# Patient Record
Sex: Female | Born: 1995 | Race: White | Hispanic: No | Marital: Married | Smoking: Never smoker
Health system: Southern US, Community
[De-identification: ages and names within clinical notes are randomized; demographics above are authoritative.]

## PROBLEM LIST (undated history)

## (undated) DIAGNOSIS — K219 Gastro-esophageal reflux disease without esophagitis: Secondary | ICD-10-CM

## (undated) DIAGNOSIS — O24419 Gestational diabetes mellitus in pregnancy, unspecified control: Secondary | ICD-10-CM

## (undated) HISTORY — DX: Gestational diabetes mellitus in pregnancy, unspecified control: O24.419

---

## 2014-04-08 ENCOUNTER — Emergency Department: Payer: Self-pay | Admitting: Emergency Medicine

## 2016-02-18 DIAGNOSIS — M25571 Pain in right ankle and joints of right foot: Secondary | ICD-10-CM | POA: Diagnosis not present

## 2016-02-21 DIAGNOSIS — S92109A Unspecified fracture of unspecified talus, initial encounter for closed fracture: Secondary | ICD-10-CM | POA: Diagnosis not present

## 2016-02-21 DIAGNOSIS — M25572 Pain in left ankle and joints of left foot: Secondary | ICD-10-CM | POA: Diagnosis not present

## 2016-02-28 DIAGNOSIS — M79672 Pain in left foot: Secondary | ICD-10-CM | POA: Diagnosis not present

## 2016-02-28 DIAGNOSIS — M25572 Pain in left ankle and joints of left foot: Secondary | ICD-10-CM | POA: Diagnosis not present

## 2016-03-07 NOTE — H&P (Signed)
Laurie Burns comes in with a new problem.  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  Longstanding issues bunion first MP joint, right foot.  Getting progressively worse.  Does not like the extreme of motion.  Altering what she can wear foot wear wise.  No numbness.  No tingling.  No symptoms opposite foot.   History and general exam are reviewed.   EXAMINATION: Specifically, healthy 20 year-old female.  Height: 5?10.  Weight: 150 pounds.  Normal ligamentous laxity.  Normal gait and stance.  Right foot has hallux valgus without significant metatarsus prima vara.  Sore medially.  A little over her sesamoids, but most of this is medial.  Neurovascularly intact.  Hallux valgus is moderately correctable.  No grating.  No crepitus.  No hallux rigidus.  No symptoms opposite left foot.    X-RAYS: Three view x-rays on the right with comparison, AP both feet, completed.  No degenerative changes.  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  There is also what appears to be a loose body or old bony injury at the base of the proximal phalanx laterally.  This is on the left asymptomatic side.  First-second intermetatarsal angle 9.4 on the left and 11.6 on the right.  Not significant metatarsus prima vara.    IMPRESSION: Worsening symptoms of hallux valgus, right foot.    PLAN: We have discussed definitive treatment.  Intermetatarsal angle not bad.  We have talked about [employer] corrective osteotomy.  Procedure, risks, benefits and complications reviewed.  Paperwork complete.  All questions answered.  What to expect post-op reviewed.  She is going to try to arrange this over Christmas break.  See her at the time of operative intervention.    

## 2016-03-16 ENCOUNTER — Encounter (HOSPITAL_BASED_OUTPATIENT_CLINIC_OR_DEPARTMENT_OTHER): Payer: Self-pay

## 2016-03-16 ENCOUNTER — Ambulatory Visit (HOSPITAL_BASED_OUTPATIENT_CLINIC_OR_DEPARTMENT_OTHER): Admit: 2016-03-16 | Payer: Self-pay | Admitting: Orthopedic Surgery

## 2016-03-16 DIAGNOSIS — M79672 Pain in left foot: Secondary | ICD-10-CM | POA: Diagnosis not present

## 2016-03-16 DIAGNOSIS — M25572 Pain in left ankle and joints of left foot: Secondary | ICD-10-CM | POA: Diagnosis not present

## 2016-03-16 DIAGNOSIS — S92109A Unspecified fracture of unspecified talus, initial encounter for closed fracture: Secondary | ICD-10-CM | POA: Diagnosis not present

## 2016-03-16 SURGERY — BUNIONECTOMY, WITH METATARSAL OSTEOTOMY
Anesthesia: General | Laterality: Right

## 2016-03-17 DIAGNOSIS — Z6824 Body mass index (BMI) 24.0-24.9, adult: Secondary | ICD-10-CM | POA: Diagnosis not present

## 2016-03-17 DIAGNOSIS — Z01419 Encounter for gynecological examination (general) (routine) without abnormal findings: Secondary | ICD-10-CM | POA: Diagnosis not present

## 2016-04-05 DIAGNOSIS — H5213 Myopia, bilateral: Secondary | ICD-10-CM | POA: Diagnosis not present

## 2016-12-06 DIAGNOSIS — H539 Unspecified visual disturbance: Secondary | ICD-10-CM | POA: Diagnosis not present

## 2016-12-06 DIAGNOSIS — R112 Nausea with vomiting, unspecified: Secondary | ICD-10-CM | POA: Diagnosis not present

## 2016-12-06 DIAGNOSIS — G43909 Migraine, unspecified, not intractable, without status migrainosus: Secondary | ICD-10-CM | POA: Diagnosis not present

## 2016-12-11 ENCOUNTER — Encounter: Payer: Self-pay | Admitting: *Deleted

## 2016-12-11 ENCOUNTER — Emergency Department
Admission: EM | Admit: 2016-12-11 | Discharge: 2016-12-11 | Disposition: A | Discharge status: Home / Self Care | Payer: BLUE CROSS/BLUE SHIELD | Attending: Emergency Medicine | Admitting: Emergency Medicine

## 2016-12-11 DIAGNOSIS — R51 Headache: Secondary | ICD-10-CM | POA: Diagnosis present

## 2016-12-11 DIAGNOSIS — G43109 Migraine with aura, not intractable, without status migrainosus: Secondary | ICD-10-CM | POA: Insufficient documentation

## 2016-12-11 LAB — POCT PREGNANCY, URINE: PREG TEST UR: NEGATIVE

## 2016-12-11 NOTE — ED Triage Notes (Signed)
States on Wednesday she had a migraine and was given the migraine cocktail, states since then she has felt "out of it", states continued headache and neck stiffness, states she goes to [employer] and is worried something is going around

## 2016-12-11 NOTE — ED Notes (Signed)
AAOx3.  Skin warm and dry.  MAE equally and strong. Ambulates with easy and steady gait.

## 2016-12-11 NOTE — ED Provider Notes (Signed)
[facility] Emergency Department Provider Note   ____________________________________________    I have reviewed the triage vital signs and the nursing notes.   HISTORY  Chief Complaint Headache     HPI Laurie Burns is a 21 y.o. female Who presents with complaints of mild intermittent headaches over the last 5-6 days. Patient reports she has a history of migraines and she has been having ROS prior to these headaches but they've not been as severe as typical for her. Sheis concerned because her college notified her that there was a Consulting civil engineer with meningitis there but she had no interaction with the student. She does not have fevers or chills. She has no altered mental status. No neuro deficits. Overall she feels quite well, mother agrees. She has symptoms of a mildtorticollis/cervical sprain.currently she has no headache.   History reviewed. No pertinent past medical history.  There are no active problems to display for this patient.   History reviewed. No pertinent surgical history.  Prior to Admission medications   Not on File     Allergies Patient has no known allergies.  No family history on file.  Social History Social History  Substance Use Topics  . Smoking status: Never Smoker  . Smokeless tobacco: Never Used  . Alcohol use No    Review of Systems  Constitutional: No fever/chills Eyes: No visual changes.  ENT: No sore throat. Cardiovascular: Denies chest pain. Respiratory: Denies shortness of breath. Gastrointestinal: No abdominal pain.  No nausea, no vomiting.   Genitourinary: Negative for dysuria. Musculoskeletal: Negative for back pain. Skin: Negative for rash. Neurological: Negative for headaches or weakness   ____________________________________________   PHYSICAL EXAM:  VITAL SIGNS: ED Triage Vitals  Enc Vitals Group     BP 12/11/16 1115 133/80     Pulse Rate 12/11/16 1115 85     Resp 12/11/16 1115 18    Temp 12/11/16 1115 98.3 F (36.8 C)     Temp Source 12/11/16 1115 Oral     SpO2 12/11/16 1115 99 %     Weight 12/11/16 1114 68 kg (150 lb)     Height 12/11/16 1114 1.753 m ( )     Head Circumference --      Peak Flow --      Pain Score 12/11/16 1115 2     Pain Loc --      Pain Edu? --      Excl. in GC? --     Constitutional: Alert and oriented. No acute distress. Eyes: Conjunctivae are normal. PERRLA Head: Atraumatic. Nose: No congestion/rhinnorhea. Mouth/Throat: Mucous membranes are moist.   Neck:  Mild tenderness to palpation of her left trapezius insertion site Cardiovascular: Normal rate, regular rhythm. Grossly normal heart sounds.  Good peripheral circulation. Respiratory: Normal respiratory effort.  No retractions. Lungs CTAB. Gastrointestinal: Soft and nontender. No distention.  No CVA tenderness. Genitourinary: deferred Musculoskeletal:   Warm and well perfused Neurologic:  Normal speech and language. No gross focal neurologic deficits are appreciated.  Skin:  Skin is warm, dry and intact. No rash noted. Psychiatric: Mood and affect are normal. Speech and behavior are normal.  ____________________________________________   LABS (all labs ordered are listed, but only abnormal results are displayed)  Labs Reviewed  POCT PREGNANCY, URINE   ____________________________________________  EKG  None ____________________________________________  RADIOLOGY  None ____________________________________________   PROCEDURES  Procedure(s) performed: No    Critical Care performed: No ____________________________________________   INITIAL IMPRESSION / ASSESSMENT AND PLAN /  ED COURSE  Pertinent labs & imaging results that were available during my care of the patient were reviewed by me and considered in my medical decision making (see chart for details).  Patient well-appearing and in no distress. Laughing and joking with her mother. No evidence or concern  for meningitis. I suspect subacute migraines, she received a prescription for fioricet from prior visit. I have encouraged her to take this. Outpatient follow-up as necessary. She and mother are content with this plan    ____________________________________________   FINAL CLINICAL IMPRESSION(S) / ED DIAGNOSES  Final diagnoses:  Migraine with aura and without status migrainosus, not intractable      NEW MEDICATIONS STARTED DURING THIS VISIT:  There are no discharge medications for this patient.    Note:  This document was prepared using Dragon voice recognition software and may include unintentional dictation errors.    Jene EveryKinner, Daielle Melcher, MD 12/11/16 1455

## 2016-12-11 NOTE — ED Notes (Signed)
FN: pt presents with headache and neck pain. States "something is going around at the college".

## 2017-01-05 DIAGNOSIS — G43119 Migraine with aura, intractable, without status migrainosus: Secondary | ICD-10-CM | POA: Diagnosis not present

## 2017-01-05 DIAGNOSIS — M7918 Myalgia, other site: Secondary | ICD-10-CM | POA: Diagnosis not present

## 2017-05-28 DIAGNOSIS — H5213 Myopia, bilateral: Secondary | ICD-10-CM | POA: Diagnosis not present

## 2017-07-27 DIAGNOSIS — Z01419 Encounter for gynecological examination (general) (routine) without abnormal findings: Secondary | ICD-10-CM | POA: Diagnosis not present

## 2017-07-27 DIAGNOSIS — Z6824 Body mass index (BMI) 24.0-24.9, adult: Secondary | ICD-10-CM | POA: Diagnosis not present

## 2017-08-21 DIAGNOSIS — N946 Dysmenorrhea, unspecified: Secondary | ICD-10-CM | POA: Diagnosis not present

## 2017-12-28 ENCOUNTER — Encounter: Payer: Self-pay | Admitting: Primary Care

## 2017-12-28 ENCOUNTER — Ambulatory Visit: Payer: BLUE CROSS/BLUE SHIELD | Admitting: Primary Care

## 2017-12-28 VITALS — BP 100/72 | HR 71 | Temp 98.2°F | Ht 69.0 in | Wt 176.5 lb

## 2017-12-28 DIAGNOSIS — G43709 Chronic migraine without aura, not intractable, without status migrainosus: Secondary | ICD-10-CM

## 2017-12-28 DIAGNOSIS — G43909 Migraine, unspecified, not intractable, without status migrainosus: Secondary | ICD-10-CM | POA: Insufficient documentation

## 2017-12-28 DIAGNOSIS — F32A Depression, unspecified: Secondary | ICD-10-CM

## 2017-12-28 DIAGNOSIS — F419 Anxiety disorder, unspecified: Secondary | ICD-10-CM | POA: Diagnosis not present

## 2017-12-28 DIAGNOSIS — F329 Major depressive disorder, single episode, unspecified: Secondary | ICD-10-CM

## 2017-12-28 MED ORDER — FLUOXETINE HCL 10 MG PO TABS: 10.0000 mg | ORAL_TABLET | Freq: Every day | ORAL | 1 refills | Status: DC

## 2017-12-28 NOTE — Patient Instructions (Signed)
Start fluoxetine 10 mg tablets daily for anxiety and depression.  Schedule a follow up visit with me in 6 weeks.   It was a pleasure to meet you today! Please don't hesitate to call or message me with any questions. Welcome to [facility]!

## 2017-12-28 NOTE — Assessment & Plan Note (Signed)
Chronic for years, no prior treatment. Increased symptoms for the last 6 months. GAD 7 score of 15 and PHQ 9 score of 19 today.   Discussed various treatment options including therapy vs medication. She opts for both. Referral placed for therapy.   Rx for fluoxetine 10 mg sent to pharmacy. We discussed possible side effects of headache, GI upset, drowsiness, and SI/HI. If thoughts of SI/HI develop, we discussed to present to the emergency immediately. Patient verbalized understanding.   Follow up in 6 weeks for re-evaluation.

## 2017-12-28 NOTE — Assessment & Plan Note (Signed)
Chronic and infrequent overall. Continue PRN sumatriptan 100 mg.

## 2017-12-28 NOTE — Progress Notes (Signed)
Subjective:    Patient ID: Laurie Burns, female    DOB: 02/07/1996, 22 y.o.   MRN: [id number]  HPI  Laurie Burns is a 22 year old female who presents today to establish care and discuss the problems mentioned below. Will obtain old records.  1) Anxiety and Depression: History of intermittent symptoms of anxiety and depression for years, has never been treated. Over the last 6 months she's noticed increased symptoms of tearfulness, panic attacks, feeling anxious, decreased self worth, low confidence, worry. She was at a football game having a good time and suddenly experienced a panic attack. She denies SI/HI. GAD 7 score of 15 and PHQ 9 score of 19 today.   2) Migraines: Chronic and intermittent for years. Once following with neurology who provided her with a prescription for sumatriptan 100 mg. Her migraines are typically located behind her eyes. She'll also experience symptoms of nausea, photophobia, phonophobia. Her last migraine was this past Summer 2019.  Review of Systems  Respiratory: Negative for shortness of breath.   Cardiovascular: Negative for chest pain.  Genitourinary: Negative for menstrual problem.  Neurological:       History of migraines  Psychiatric/Behavioral:       See HPI       History reviewed. No pertinent past medical history.   Social History   Socioeconomic History  . Marital status: Single    Spouse name: Not on file  . Number of children: Not on file  . Years of education: Not on file  . Highest education level: Not on file  Occupational History  . Not on file  Social Needs  . Financial resource strain: Not on file  . Food insecurity:    Worry: Not on file    Inability: Not on file  . Transportation needs:    Medical: Not on file    Non-medical: Not on file  Tobacco Use  . Smoking status: Never Smoker  . Smokeless tobacco: Never Used  Substance and Sexual Activity  . Alcohol use: No  . Drug use: Not on file  . Sexual activity: Not on  file  Lifestyle  . Physical activity:    Days per week: Not on file    Minutes per session: Not on file  . Stress: Not on file  Relationships  . Social connections:    Talks on phone: Not on file    Gets together: Not on file    Attends religious service: Not on file    Active member of club or organization: Not on file    Attends meetings of clubs or organizations: Not on file    Relationship status: Not on file  . Intimate partner violence:    Fear of current or ex partner: Not on file    Emotionally abused: Not on file    Physically abused: Not on file    Forced sexual activity: Not on file  Other Topics Concern  . Not on file  Social History Narrative   Graduated at [employer]   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    History reviewed. No pertinent surgical history.  No family history on file.  No Known Allergies  Current Outpatient Medications on File Prior to Visit  Medication Sig Dispense Refill  . LO LOESTRIN FE 1 MG-10 MCG / 10 MCG tablet Take 1 tablet by mouth daily.   3  . SUMAtriptan (IMITREX) 100 MG tablet Take 100 mg by mouth every 2 (two) hours as needed. May  repeat in 2 hours if headache persists or recurs.     No current facility-administered medications on file prior to visit.     BP 100/72   Pulse 71   Temp 98.2 F (36.8 C) (Oral)   Ht 5\' 9"  (1.753 m)   Wt 176 lb 8 oz (80.1 kg)   LMP 12/16/2017   SpO2 98%   BMI 26.06 kg/m    Objective:   Physical Exam  Constitutional: She appears well-nourished.  Neck: Neck supple.  Cardiovascular: Normal rate and regular rhythm.  Respiratory: Effort normal and breath sounds normal.  Skin: Skin is warm and dry.  Psychiatric: She has a normal mood and affect.           Assessment & Plan:

## 2018-01-14 ENCOUNTER — Ambulatory Visit: Payer: BLUE CROSS/BLUE SHIELD | Admitting: Family Medicine

## 2018-01-14 ENCOUNTER — Encounter: Payer: Self-pay | Admitting: Family Medicine

## 2018-01-14 VITALS — BP 118/76 | HR 95 | Temp 98.3°F | Ht 69.0 in | Wt 178.0 lb

## 2018-01-14 DIAGNOSIS — H6593 Unspecified nonsuppurative otitis media, bilateral: Secondary | ICD-10-CM | POA: Diagnosis not present

## 2018-01-14 DIAGNOSIS — H65193 Other acute nonsuppurative otitis media, bilateral: Secondary | ICD-10-CM

## 2018-01-14 DIAGNOSIS — H6591 Unspecified nonsuppurative otitis media, right ear: Secondary | ICD-10-CM

## 2018-01-14 DIAGNOSIS — H9313 Tinnitus, bilateral: Secondary | ICD-10-CM | POA: Diagnosis not present

## 2018-01-14 MED ORDER — AMOXICILLIN-POT CLAVULANATE 875-125 MG PO TABS: 1.0000 | ORAL_TABLET | Freq: Two times a day (BID) | ORAL | 0 refills | Status: DC

## 2018-01-14 NOTE — Patient Instructions (Signed)
Take either Claritin, zyrtec or allegra once daily for next 1-2 weeks to help dry up ear/sinus congestion

## 2018-01-14 NOTE — Progress Notes (Signed)
   Subjective:    Patient ID: Laurie Burns, female    DOB: 02-Jun-1995, 22 y.o.   MRN: [id number]  HPI  Patient presents to clinic c/o ringing & pain in both ears for 4-5 days. Right ear is worse than left. She did take some sudafed for a few doses and this seemed to help relieve some pressure, but pain persisted. Denies fever or chills. Denies hearing loss.   She is a patient of Vernona Rieger NP at [facility]. She was recently started on fluoxetine to treat anxiety & depression and uses imitrex as needed for migraine.   Patient Active Problem List   Diagnosis Date Noted  . Anxiety and depression 12/28/2017  . Migraines 12/28/2017   Social History   Tobacco Use  . Smoking status: Never Smoker  . Smokeless tobacco: Never Used  Substance Use Topics  . Alcohol use: No   Review of Systems   Constitutional: Negative for chills, fatigue and fever.  HENT: +ringing in both ears, bilat ear pain R>L, +pressure in ears.   Eyes: Negative.   Respiratory: Negative for cough, shortness of breath and wheezing.   Cardiovascular: Negative for chest pain, palpitations and leg swelling.  Gastrointestinal: Negative for abdominal pain, diarrhea, nausea and vomiting.  Genitourinary: Negative for dysuria, frequency and urgency.  Musculoskeletal: Negative for arthralgias and myalgias.  Skin: Negative for color change, pallor and rash.  Neurological: Negative for syncope, light-headedness and headaches.  Psychiatric/Behavioral: The patient is not nervous/anxious.       Objective:   Physical Exam  Constitutional: She is oriented to person, place, and time. She appears well-nourished. No distress.  HENT:  Head: Normocephalic and atraumatic.  Right Ear: Hearing normal. Tympanic membrane is injected, erythematous and bulging. A middle ear effusion is present.  Left Ear: Hearing normal. Tympanic membrane is bulging. A middle ear effusion is present.  Eyes: EOM are normal. No scleral icterus.    Neck: Neck supple. No tracheal deviation present.  Cardiovascular: Normal rate and regular rhythm.  Pulmonary/Chest: Effort normal and breath sounds normal. No respiratory distress.  Musculoskeletal: Normal range of motion. She exhibits no edema.  Neurological: She is alert and oriented to person, place, and time. No cranial nerve deficit.  Skin: Skin is warm and dry. No pallor.  Psychiatric: She has a normal mood and affect. Her behavior is normal.  Nursing note and vitals reviewed.  Vitals:   01/14/18 1327  BP: 118/76  Pulse: 95  Temp: 98.3 F (36.8 C)  SpO2: 99%      Assessment & Plan:   Right otitis medica/bilat middle ear effusions -- Augmentin BID for 10 days to treat ear infection. Advised to take daily OTC antihistamine like Claritin, allegra or zyrtec consistently for next 1-2 weeks and can also use sudafed as needed to help congestion.   Ringing in ears -- Suspect it is related to ear infection and fluid behind TMs, if this does not resolve once ear infection treatment is complete. Return to clinic for recheck  Keep regularly scheduled follow up as planned. Return to clinic sooner if needed.

## 2018-01-17 ENCOUNTER — Telehealth: Payer: Self-pay | Admitting: Primary Care

## 2018-01-17 NOTE — Telephone Encounter (Signed)
Copied from CRM [id number]. Topic: General - Inquiry >> Jan 17, 2018 10:26 AM Alexander Bergeron B wrote: Reason for CRM: pt is calling today b/c the ear infection she was seen for and given abx; she is not feeling better at all; pt is calling for advisement on what to do next or if she should just give it more time to give the abx to work; pt is feeling ear ringing; while swallowing she hears a crackling sound; pt is asking for a nurse to contact her for assistance; contact pt to advise

## 2018-01-17 NOTE — Telephone Encounter (Signed)
Please tell patient to take allegra 180 mg once daily and can also take sudafed OTC to help congestion. Also can do saline nasal sprays to help congestion. Please complete full course of antibiotics as well. The allegra and sudafed will help dry up congestion.

## 2018-01-17 NOTE — Telephone Encounter (Signed)
Called Pt to see if she took all of her antibiotics which has not. Told Pt she needs to finish taking the antibiotics. I also told Pt about taking Allegra and Sudafed as well. Pt stated she understood and had no questions.

## 2018-01-29 ENCOUNTER — Ambulatory Visit: Payer: BLUE CROSS/BLUE SHIELD | Admitting: Psychology

## 2018-02-14 ENCOUNTER — Ambulatory Visit: Payer: BLUE CROSS/BLUE SHIELD | Admitting: Primary Care

## 2018-02-14 ENCOUNTER — Encounter: Payer: Self-pay | Admitting: Primary Care

## 2018-02-14 DIAGNOSIS — F419 Anxiety disorder, unspecified: Secondary | ICD-10-CM | POA: Diagnosis not present

## 2018-02-14 DIAGNOSIS — F329 Major depressive disorder, single episode, unspecified: Secondary | ICD-10-CM

## 2018-02-14 DIAGNOSIS — F32A Depression, unspecified: Secondary | ICD-10-CM

## 2018-02-14 MED ORDER — FLUOXETINE HCL 10 MG PO TABS: 10.0000 mg | ORAL_TABLET | Freq: Every day | ORAL | 1 refills | Status: DC

## 2018-02-14 NOTE — Progress Notes (Signed)
Subjective:    Patient ID: Laurie Burns, female    DOB: [birth year], 22 y.o.   MRN: [id number]  HPI  [name] is a 22 year old female who presents today for follow up of anxiety and depression.  She was last evaluated in late September 2019 with reports of chronic symptoms of panic attacks, tearfulness, feeling anxious, decreased self worth, daily worry. GAD 7 score of 15 and PHQ 9 score of 19 that day so we initiated Fluoxetine 10 mg and referred her for therapy.  Since her last visit she's doing better. She accidentally missed her appointment with the therapist, plans on rescheduling. Positive effects of her medication include able to manage stress better, finds that she dwells less on things, improved self worth, less tearfulness. She does have difficulty sleeping as her mind will race. This was problematic before treatment. She does apply essential oils with some improvement.   She denies SI/HI, nausea, GI upset. She does have intermittent loose stools.   Review of Systems  Respiratory: Negative for shortness of breath.   Cardiovascular: Negative for chest pain.  Gastrointestinal: Negative for abdominal pain and nausea.  Psychiatric/Behavioral: Positive for sleep disturbance. Negative for suicidal ideas.       See HPI       No past medical history on file.   Social History   Socioeconomic History  . Marital status: Single    Spouse name: Not on file  . Number of children: Not on file  . Years of education: Not on file  . Highest education level: Not on file  Occupational History  . Not on file  Social Needs  . Financial resource strain: Not on file  . Food insecurity:    Worry: Not on file    Inability: Not on file  . Transportation needs:    Medical: Not on file    Non-medical: Not on file  Tobacco Use  . Smoking status: Never Smoker  . Smokeless tobacco: Never Used  Substance and Sexual Activity  . Alcohol use: No  . Drug use: Not on file  . Sexual  activity: Not on file  Lifestyle  . Physical activity:    Days per week: Not on file    Minutes per session: Not on file  . Stress: Not on file  Relationships  . Social connections:    Talks on phone: Not on file    Gets together: Not on file    Attends religious service: Not on file    Active member of club or organization: Not on file    Attends meetings of clubs or organizations: Not on file    Relationship status: Not on file  . Intimate partner violence:    Fear of current or ex partner: Not on file    Emotionally abused: Not on file    Physically abused: Not on file    Forced sexual activity: Not on file  Other Topics Concern  . Not on file  Social History Narrative   Graduated at [employer]   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    No past surgical history on file.  No family history on file.  No Known Allergies  Current Outpatient Medications on File Prior to Visit  Medication Sig Dispense Refill  . FLUoxetine (PROZAC) 10 MG tablet Take 1 tablet (10 mg total) by mouth daily. 30 tablet 1  . LO LOESTRIN FE 1 MG-10 MCG / 10 MCG tablet Take 1 tablet by mouth daily.  3  . SUMAtriptan (IMITREX) 100 MG tablet Take 100 mg by mouth every 2 (two) hours as needed. May repeat in 2 hours if headache persists or recurs.     No current facility-administered medications on file prior to visit.     BP 108/72   Pulse 75   Temp 98 F (36.7 C) (Oral)   Ht 5\' 9"  (1.753 m)   Wt 182 lb (82.6 kg)   LMP 01/27/2018   SpO2 97%   BMI 26.88 kg/m    Objective:   Physical Exam  Constitutional: She appears well-nourished.  Cardiovascular: Normal rate and regular rhythm.  Respiratory: Effort normal and breath sounds normal.  Skin: Skin is warm and dry.  Psychiatric: She has a normal mood and affect.           Assessment & Plan:

## 2018-02-14 NOTE — Assessment & Plan Note (Signed)
Improved on Fluoxetine 10 mg. Denies SI/HI. Discussed to reschedule an appointment with the therapist. Continue Fluoxetine 10 mg for now.  Trial of Melatonin 5 mg-10 mg HS for sleep. She will update.

## 2018-02-14 NOTE — Patient Instructions (Signed)
Continue fluoxetine 10 mg for anxiety and depression  You may try Melatonin 5 mg tablets for sleep. Take 1-2 hours prior to bedtime. Max dose is 10 mg in 24 hours.  Please don't hesitate to message me with any questions.  It was a pleasure to see you today!

## 2018-02-20 DIAGNOSIS — S60012A Contusion of left thumb without damage to nail, initial encounter: Secondary | ICD-10-CM | POA: Diagnosis not present

## 2018-06-28 ENCOUNTER — Telehealth: Payer: Self-pay

## 2018-06-28 NOTE — Telephone Encounter (Signed)
Received IBC from patient reporting increased abdominal pain, back pain, and numbness in right arm secondary to pain. Patient thinks it may be related to gallbladder.   Pain scale: 7/10 Takes Ibuprofen OTC.  Patient is able to complete Webex and can be reached at [phone].

## 2018-06-28 NOTE — Telephone Encounter (Signed)
Needs office visit for this

## 2018-06-28 NOTE — Telephone Encounter (Signed)
Please advise 

## 2018-07-01 DIAGNOSIS — Z7189 Other specified counseling: Secondary | ICD-10-CM | POA: Diagnosis not present

## 2018-07-01 NOTE — Telephone Encounter (Signed)
Message left for patient to return my call.  Also sent patient a message through MyChart 

## 2018-07-01 NOTE — Telephone Encounter (Signed)
Appointment has been made on 07/02/2018

## 2018-07-01 NOTE — Telephone Encounter (Signed)
Laurie Burns is out for the day, can you get her scheduled?

## 2018-07-01 NOTE — Telephone Encounter (Signed)
Laurie Burns, she needs an office visit for this if possible. See phone note that I sent you on 03/27.

## 2018-07-02 ENCOUNTER — Other Ambulatory Visit: Payer: Self-pay

## 2018-07-02 ENCOUNTER — Ambulatory Visit: Payer: BLUE CROSS/BLUE SHIELD | Admitting: Primary Care

## 2018-07-02 VITALS — BP 110/74 | HR 83 | Temp 98.0°F | Ht 69.0 in | Wt 184.5 lb

## 2018-07-02 DIAGNOSIS — Z03818 Encounter for observation for suspected exposure to other biological agents ruled out: Secondary | ICD-10-CM | POA: Diagnosis not present

## 2018-07-02 DIAGNOSIS — R1084 Generalized abdominal pain: Secondary | ICD-10-CM | POA: Diagnosis not present

## 2018-07-02 LAB — POC URINALSYSI DIPSTICK (AUTOMATED)
Bilirubin, UA: NEGATIVE
Glucose, UA: NEGATIVE
Ketones, UA: NEGATIVE
LEUKOCYTES UA: NEGATIVE
NITRITE UA: NEGATIVE
PH UA: 6 (ref 5.0–8.0)
PROTEIN UA: NEGATIVE
RBC UA: NEGATIVE
Spec Grav, UA: 1.015 (ref 1.010–1.025)
Urobilinogen, UA: 0.2 E.U./dL

## 2018-07-02 LAB — POCT URINE PREGNANCY: Preg Test, Ur: NEGATIVE

## 2018-07-02 NOTE — Progress Notes (Signed)
Subjective:    Patient ID: Laurie Burns, female    DOB: [birth year], 23 y.o.   MRN: [id number]  HPI  [name] is a 23 year old female who presents today with a chief complaint of abdominal pain.  Her pain is located to the right upper and lower abdomen. She also reports right upper extremity numbness, right lower back pain, nausea, constipation with firm stools. Her symptoms will occur with and without meals. She has noticed some bright red blood on the toilet paper after straining for a bowel movement a few times. She denies changes in her diet, new medications, changes in exercise, increased stress. Her symptoms are intermittent and began about 3-4 months ago, will sometimes bother her at night preventing her from sleeping. Several days ago was her worst episode.  She denies hematuria, urinary frequency, dysuria, fevers, vomiting. She's taken Gas-X, Tylenol at times. LMP was two weeks ago, approximately. She does have a family history of gall bladder disease.  Review of Systems  Constitutional: Negative for fatigue and fever.  Respiratory: Negative for shortness of breath.   Gastrointestinal: Positive for abdominal pain, constipation and nausea. Negative for diarrhea and vomiting.  Genitourinary: Negative for dysuria, frequency, menstrual problem and vaginal discharge.       No past medical history on file.   Social History   Socioeconomic History  . Marital status: Single    Spouse name: Not on file  . Number of children: Not on file  . Years of education: Not on file  . Highest education level: Not on file  Occupational History  . Not on file  Social Needs  . Financial resource strain: Not on file  . Food insecurity:    Worry: Not on file    Inability: Not on file  . Transportation needs:    Medical: Not on file    Non-medical: Not on file  Tobacco Use  . Smoking status: Never Smoker  . Smokeless tobacco: Never Used  Substance and Sexual Activity  . Alcohol use:  No  . Drug use: Not on file  . Sexual activity: Not on file  Lifestyle  . Physical activity:    Days per week: Not on file    Minutes per session: Not on file  . Stress: Not on file  Relationships  . Social connections:    Talks on phone: Not on file    Gets together: Not on file    Attends religious service: Not on file    Active member of club or organization: Not on file    Attends meetings of clubs or organizations: Not on file    Relationship status: Not on file  . Intimate partner violence:    Fear of current or ex partner: Not on file    Emotionally abused: Not on file    Physically abused: Not on file    Forced sexual activity: Not on file  Other Topics Concern  . Not on file  Social History Narrative   Graduated at [employer]   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    No past surgical history on file.  No family history on file.  No Known Allergies  Current Outpatient Medications on File Prior to Visit  Medication Sig Dispense Refill  . FLUoxetine (PROZAC) 10 MG tablet Take 1 tablet (10 mg total) by mouth daily. 90 tablet 1  . LO LOESTRIN FE 1 MG-10 MCG / 10 MCG tablet Take 1 tablet by mouth daily.  3  . SUMAtriptan (IMITREX) 100 MG tablet Take 100 mg by mouth every 2 (two) hours as needed. May repeat in 2 hours if headache persists or recurs.     No current facility-administered medications on file prior to visit.     BP 110/74   Pulse 83   Temp 98 F (36.7 C) (Oral)   Ht 5\' 9"  (1.753 m)   Wt 184 lb 8 oz (83.7 kg)   LMP 05/05/2018 (Within Weeks)   SpO2 98%   BMI 27.25 kg/m    Objective:   Physical Exam  Constitutional: She appears well-nourished.  Neck: Neck supple.  Cardiovascular: Normal rate and regular rhythm.  Respiratory: Effort normal and breath sounds normal.  GI: Soft. Normal appearance and bowel sounds are normal. There is no rebound, no CVA tenderness and negative Murphy's sign.    Mild tenderness upon palpation to RUQ, right mid quadrant, and  umbilical region.   Skin: Skin is warm and dry.           Assessment & Plan:

## 2018-07-02 NOTE — Patient Instructions (Signed)
Stop by the lab prior to leaving today. I will notify you of your results once received.   You will be contacted regarding your ultrasound.  Please let us know if you have not been contacted within one week.   Start Miralax for constipation. Take 1 capful and mix into water once daily for one week. Reduce to every other day if you experience diarrhea.   It was a pleasure to see you today!

## 2018-07-02 NOTE — Assessment & Plan Note (Signed)
Located to right upper, mid, and lower abdomen with mild tenderness on exam. Appears well, no distress.  Differential diagnoses include cholelithiasis/cystitis, ovarian cyst, IBS, constipation.   UA today negative. Urine pregnancy negative.  Check labs. Orders for ultrasound placed. If all work up negative then will consider IBS treatment. Discussed Miralax for constipation.

## 2018-07-03 LAB — CBC WITH DIFFERENTIAL/PLATELET
BASOS ABS: 0 10*3/uL (ref 0.0–0.1)
BASOS PCT: 0.3 % (ref 0.0–3.0)
Eosinophils Absolute: 0.1 10*3/uL (ref 0.0–0.7)
Eosinophils Relative: 1 % (ref 0.0–5.0)
HCT: 41.9 % (ref 36.0–46.0)
HEMOGLOBIN: 14.2 g/dL (ref 12.0–15.0)
Lymphocytes Relative: 21.7 % (ref 12.0–46.0)
Lymphs Abs: 1.9 10*3/uL (ref 0.7–4.0)
MCHC: 33.8 g/dL (ref 30.0–36.0)
MCV: 89.7 fl (ref 78.0–100.0)
MONOS PCT: 6 % (ref 3.0–12.0)
Monocytes Absolute: 0.5 10*3/uL (ref 0.1–1.0)
NEUTROS ABS: 6.2 10*3/uL (ref 1.4–7.7)
NEUTROS PCT: 71 % (ref 43.0–77.0)
Platelets: 244 10*3/uL (ref 150.0–400.0)
RBC: 4.67 Mil/uL (ref 3.87–5.11)
RDW: 12.7 % (ref 11.5–15.5)
WBC: 8.8 10*3/uL (ref 4.0–10.5)

## 2018-07-03 LAB — COMPREHENSIVE METABOLIC PANEL
ALBUMIN: 4.2 g/dL (ref 3.5–5.2)
ALK PHOS: 47 U/L (ref 39–117)
ALT: 17 U/L (ref 0–35)
AST: 19 U/L (ref 0–37)
BUN: 14 mg/dL (ref 6–23)
CO2: 25 mEq/L (ref 19–32)
Calcium: 9.2 mg/dL (ref 8.4–10.5)
Chloride: 104 mEq/L (ref 96–112)
Creatinine, Ser: 0.88 mg/dL (ref 0.40–1.20)
GFR: 79.15 mL/min (ref >60.00)
GLUCOSE: 82 mg/dL (ref 70–99)
Potassium: 4.4 mEq/L (ref 3.5–5.1)
Sodium: 137 mEq/L (ref 135–145)
TOTAL PROTEIN: 7.2 g/dL (ref 6.0–8.3)
Total Bilirubin: 0.3 mg/dL (ref 0.2–1.2)

## 2018-07-05 ENCOUNTER — Ambulatory Visit
Admission: RE | Admit: 2018-07-05 | Discharge: 2018-07-05 | Disposition: A | Discharge status: Home / Self Care | Payer: BLUE CROSS/BLUE SHIELD | Source: Ambulatory Visit | Attending: Primary Care | Admitting: Primary Care

## 2018-07-05 ENCOUNTER — Other Ambulatory Visit: Payer: Self-pay

## 2018-07-05 DIAGNOSIS — R1084 Generalized abdominal pain: Secondary | ICD-10-CM

## 2018-07-05 DIAGNOSIS — R1011 Right upper quadrant pain: Secondary | ICD-10-CM | POA: Diagnosis not present

## 2018-08-25 DIAGNOSIS — R51 Headache: Secondary | ICD-10-CM | POA: Diagnosis not present

## 2018-08-27 DIAGNOSIS — R51 Headache: Secondary | ICD-10-CM | POA: Diagnosis not present

## 2018-08-27 DIAGNOSIS — Z20828 Contact with and (suspected) exposure to other viral communicable diseases: Secondary | ICD-10-CM | POA: Diagnosis not present

## 2018-10-09 ENCOUNTER — Telehealth: Payer: Self-pay | Admitting: Primary Care

## 2018-10-09 NOTE — Telephone Encounter (Signed)
Called to get clarification on whether patient wanted to come in for 7/9 appt or discuss virtually. Lvm asking pt to call office.

## 2018-10-10 ENCOUNTER — Other Ambulatory Visit: Payer: Self-pay

## 2018-10-10 ENCOUNTER — Ambulatory Visit (INDEPENDENT_AMBULATORY_CARE_PROVIDER_SITE_OTHER): Payer: BC Managed Care – PPO | Admitting: Primary Care

## 2018-10-10 DIAGNOSIS — F419 Anxiety disorder, unspecified: Secondary | ICD-10-CM | POA: Diagnosis not present

## 2018-10-10 DIAGNOSIS — F32A Depression, unspecified: Secondary | ICD-10-CM

## 2018-10-10 DIAGNOSIS — F329 Major depressive disorder, single episode, unspecified: Secondary | ICD-10-CM

## 2018-10-10 MED ORDER — FLUOXETINE HCL 20 MG PO TABS: 20.0000 mg | ORAL_TABLET | Freq: Every day | ORAL | 0 refills | Status: DC

## 2018-10-10 NOTE — Progress Notes (Signed)
Subjective:    Patient ID: Laurie Burns, female    DOB: Jun 05, 1995, 23 y.o.   MRN: [id number]  HPI  Virtual Visit via Video Note  I connected with Laurie Burns on 10/10/18 at  7:20 AM EDT by a video enabled telemedicine application and verified that I am speaking with the correct person using two identifiers.  Location: Patient: Parking lot Provider: Office   I discussed the limitations of evaluation and management by telemedicine and the availability of in person appointments. The patient expressed understanding and agreed to proceed.  History of Present Illness:  [name] is a 23 year old female with a history of anxiety and depression who presents today to discuss anxiety and depression.  She is currently managed on fluoxetine 10 mg which was initiated on 12/28/17 for a six month history of tearfulness, panic attacks, feeling anxious, decreased self worth, low confidence, worry. During her follow up appointment in November 2019 she endorsed doing better so her regimen was continued.   Since her last visit she's noticed that the medication isn't as effective as it once was. Symptoms include irritability, feeling down/sad, feeling anxious, low confidence, daily worry. Symptoms have slowly increased over the last several months since Covid-19, worse over the last several weeks.    Observations/Objective:  Alert and oriented. Appears well, not sickly. No distress. Speaking in complete sentences.   Assessment and Plan:  Deteriorated on fluoxetine 10 mg, once felt improved. Given that she did have improvement at one point we will trial a dose adjustment to 20 mg. She agrees. Denies SI/HI, GI upset, headaches. Rx for fluoxetine 20 mg printed per patient request. Will place up front for mom to pick up later.   Follow Up Instructions:  We've increased the dose of your fluoxetine to 20 mg. Take 1 tablet by mouth once daily.  Please send me an update via My Chart in 4  weeks as discussed.  It was a pleasure to see you today! Allie Bossier, NP-C    I discussed the assessment and treatment plan with the patient. The patient was provided an opportunity to ask questions and all were answered. The patient agreed with the plan and demonstrated an understanding of the instructions.   The patient was advised to call back or seek an in-person evaluation if the symptoms worsen or if the condition fails to improve as anticipated.     Pleas Koch, NP    Review of Systems  Respiratory: Negative for shortness of breath.   Cardiovascular: Negative for chest pain.  Gastrointestinal: Negative for nausea.  Psychiatric/Behavioral:       See HPI       No past medical history on file.   Social History   Socioeconomic History  . Marital status: Single    Spouse name: Not on file  . Number of children: Not on file  . Years of education: Not on file  . Highest education level: Not on file  Occupational History  . Not on file  Social Needs  . Financial resource strain: Not on file  . Food insecurity    Worry: Not on file    Inability: Not on file  . Transportation needs    Medical: Not on file    Non-medical: Not on file  Tobacco Use  . Smoking status: Never Smoker  . Smokeless tobacco: Never Used  Substance and Sexual Activity  . Alcohol use: No  . Drug use: Not on file  .  Sexual activity: Not on file  Lifestyle  . Physical activity    Days per week: Not on file    Minutes per session: Not on file  . Stress: Not on file  Relationships  . Social Musicianconnections    Talks on phone: Not on file    Gets together: Not on file    Attends religious service: Not on file    Active member of club or organization: Not on file    Attends meetings of clubs or organizations: Not on file    Relationship status: Not on file  . Intimate partner violence    Fear of current or ex partner: Not on file    Emotionally abused: Not on file    Physically abused:  Not on file    Forced sexual activity: Not on file  Other Topics Concern  . Not on file  Social History Narrative   Graduated at [employer]   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    No past surgical history on file.  No family history on file.  No Known Allergies  Current Outpatient Medications on File Prior to Visit  Medication Sig Dispense Refill  . LO LOESTRIN FE 1 MG-10 MCG / 10 MCG tablet Take 1 tablet by mouth daily.   3  . SUMAtriptan (IMITREX) 100 MG tablet Take 100 mg by mouth every 2 (two) hours as needed. May repeat in 2 hours if headache persists or recurs.    . [DISCONTINUED] FLUoxetine (PROZAC) 10 MG tablet Take 1 tablet (10 mg total) by mouth daily. 90 tablet 1   No current facility-administered medications on file prior to visit.     There were no vitals taken for this visit.   Objective:   Physical Exam  Constitutional: She is oriented to person, place, and time. She appears well-nourished.  Respiratory: Effort normal.  Neurological: She is alert and oriented to person, place, and time.  Psychiatric: She has a normal mood and affect.           Assessment & Plan:

## 2018-10-10 NOTE — Assessment & Plan Note (Signed)
Deteriorated on fluoxetine 10 mg, once felt improved. Given that she did have improvement at one point we will trial a dose adjustment to 20 mg. She agrees. Denies SI/HI, GI upset, headaches. Rx for fluoxetine 20 mg printed per patient request. Will place up front for mom to pick up later.

## 2018-10-10 NOTE — Patient Instructions (Signed)
We've increased the dose of your fluoxetine to 20 mg. Take 1 tablet by mouth once daily.  Please send me an update via My Chart in 4 weeks as discussed.  It was a pleasure to see you today! Allie Bossier, NP-C

## 2018-10-11 ENCOUNTER — Ambulatory Visit: Payer: BLUE CROSS/BLUE SHIELD | Admitting: Primary Care

## 2018-10-14 NOTE — Telephone Encounter (Signed)
Laurie Burns, did this go to a PA? Do I need to change from tablets to capsules?

## 2018-10-15 NOTE — Telephone Encounter (Signed)
This was addressed on 10/14/2018.  No PA need for RX. Change Rx to capsule so insurance would cover. Patient has been notified.

## 2018-11-12 ENCOUNTER — Telehealth: Payer: Self-pay

## 2018-11-12 NOTE — Telephone Encounter (Signed)
Please kindly thank patient for the update and that I'm happy to help. I'd like to meet with her virtually or in office to discuss further. Please schedule.

## 2018-11-12 NOTE — Telephone Encounter (Signed)
Spoken and notified patient of Laurie Burns comments. Patient verbalized understanding. OV on 11/13/2018

## 2018-11-12 NOTE — Telephone Encounter (Signed)
Patient called stating that she is employed at [employer] and has been on lifting restrictions since October 10, 2018.  Patient stated that she has been working with [employer].because she is pretty sure that she injured her back at work. Patient stated that she has had numerous visits with their employee doctor and he has been giving her a letter with lifting restrictions. Patient stated that she was suppose to see him today and he sent her a message telling her that there was no need for the visit today because he can not give her another letter to grant her lifting restrictions because the time frame is up. Laurie Burns stated that she is suppose to be referred for physical therapy next week. Patient stated that she was told by Dr. Geralynn Burns from [place] to reach out to her PCP and see if she can get a letter from her?  Patient stated that she is so fed up with the doctor at [facility] she does not know what to do. Patient wants advice from Laurie Bossier NP her PCP.

## 2018-11-12 NOTE — Telephone Encounter (Deleted)
error 

## 2018-11-13 ENCOUNTER — Ambulatory Visit: Payer: BC Managed Care – PPO | Admitting: Primary Care

## 2018-11-13 ENCOUNTER — Telehealth: Payer: Self-pay | Admitting: Primary Care

## 2018-11-13 DIAGNOSIS — Z0289 Encounter for other administrative examinations: Secondary | ICD-10-CM

## 2018-11-13 NOTE — Telephone Encounter (Signed)
error 

## 2018-11-15 ENCOUNTER — Encounter: Payer: Self-pay | Admitting: Primary Care

## 2018-11-15 ENCOUNTER — Ambulatory Visit (INDEPENDENT_AMBULATORY_CARE_PROVIDER_SITE_OTHER): Payer: BC Managed Care – PPO | Admitting: Primary Care

## 2018-11-15 DIAGNOSIS — M25519 Pain in unspecified shoulder: Secondary | ICD-10-CM | POA: Insufficient documentation

## 2018-11-15 DIAGNOSIS — M25511 Pain in right shoulder: Secondary | ICD-10-CM | POA: Diagnosis not present

## 2018-11-15 NOTE — Patient Instructions (Signed)
Follow up with PT as scheduled.  Keep me updated regarding your symptoms.  It was a pleasure to see you today!

## 2018-11-15 NOTE — Progress Notes (Signed)
Subjective:    Patient ID: Laurie Burns, female    DOB: May 05, 1995, 23 y.o.   MRN: [id number]  HPI  Virtual Visit via Video Note  I connected with Laurie Burns on 11/15/18 at 10:40 AM EDT by a video enabled telemedicine application and verified that I am speaking with the correct person using two identifiers.  Location: Patient: Home Provider: Office   I discussed the limitations of evaluation and management by telemedicine and the availability of in person appointments. The patient expressed understanding and agreed to proceed.  History of Present Illness:  [name] is a 23 year old female who presents today to discuss back pain.  She is an employee through [employer] system and has been on restrictions for lifting objects since October 10, 2018. She believes she was injured at work and has been following with employee health who originally provided her with lifting restrictions.   She called into our office earlier this week asking for continued lifting restrictions given refusal of continued restrictions by employee health through [place].   Today she endorses that had a "work injury" to her back on July 9th. 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 She's been on lifting restrictions to no more than 25 pounds per employee health who thought she had a "pulled muscle". She was supposed to be referred to physical therapy two weeks ago, was actually referred four days ago. She was also told that employee health could no longer write her lifting restrictions and she needed to see her PCP for further notes.  Her symptoms include constant right anterior and posterior shoulder with radiation down to her right thoracic back, sometimes down to right lower extremity. Occasional numbness/tinlging.  She has an appointment with the physical therapist on August 27th, 2020. 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   Observations/Objective:  Alert and oriented. Appears well, not sickly. No distress. Speaking in complete sentences.   Assessment and Plan:  Shoulder and back injury since maneuvering a 350 pound patient at work. Continues to struggle with symptoms of pain, sounds like there may also be nerve involvement. Agree that physical therapy is the next best step. Also agree to continue her light duty lifting restrictions for another month. She will update after PT sessions.  Follow Up Instructions:  Follow up with PT as scheduled.  Keep me updated regarding your symptoms.  It was a pleasure to see you today!    I discussed the assessment and treatment plan with the patient. The patient was provided an opportunity to ask questions and all were answered. The patient agreed with the plan and demonstrated an understanding of the instructions.   The patient was advised to call back or seek an in-person evaluation if the symptoms worsen or if the condition fails to improve as anticipated.     Pleas Koch, NP    Review of Systems  Musculoskeletal: Positive for back pain and myalgias.  Skin: Negative for color change.  Neurological: Positive for weakness and numbness.       No past medical history on file.   Social History   Socioeconomic History  . Marital status: Single    Spouse name: Not on file  . Number of children: Not on file  . Years of education: Not on file  . Highest education level: Not on file  Occupational History  .  Not on file  Social Needs  . Financial resource strain: Not on file  . Food insecurity    Worry: Not on file    Inability: Not on file  . Transportation needs    Medical: Not on file    Non-medical: Not on file  Tobacco Use  . Smoking status: Never Smoker  . Smokeless tobacco: Never Used  Substance and Sexual Activity  . Alcohol use: No  . Drug use: Not on file  . Sexual activity: Not on file  Lifestyle  . Physical  activity    Days per week: Not on file    Minutes per session: Not on file  . Stress: Not on file  Relationships  . Social Musicianconnections    Talks on phone: Not on file    Gets together: Not on file    Attends religious service: Not on file    Active member of club or organization: Not on file    Attends meetings of clubs or organizations: Not on file    Relationship status: Not on file  . Intimate partner violence    Fear of current or ex partner: Not on file    Emotionally abused: Not on file    Physically abused: Not on file    Forced sexual activity: Not on file  Other Topics Concern  . Not on file  Social History Narrative   Graduated at [employer]   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    No past surgical history on file.  No family history on file.  No Known Allergies  Current Outpatient Medications on File Prior to Visit  Medication Sig Dispense Refill  . FLUoxetine (PROZAC) 20 MG tablet Take 1 tablet (20 mg total) by mouth daily. For anxiety and depression. 90 tablet 0  . LO LOESTRIN FE 1 MG-10 MCG / 10 MCG tablet Take 1 tablet by mouth daily.   3  . SUMAtriptan (IMITREX) 100 MG tablet Take 100 mg by mouth every 2 (two) hours as needed. May repeat in 2 hours if headache persists or recurs.     No current facility-administered medications on file prior to visit.     LMP 11/03/2018    Objective:   Physical Exam  Constitutional: She is oriented to person, place, and time. She appears well-nourished.  Respiratory: Effort normal.  Musculoskeletal:     Right shoulder: She exhibits decreased range of motion and pain.  Neurological: She is alert and oriented to person, place, and time.  Psychiatric: She has a normal mood and affect.           Assessment & Plan:

## 2018-11-15 NOTE — Assessment & Plan Note (Signed)
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 Continues to struggle with symptoms of pain, sounds like there may also be nerve involvement. Agree that physical therapy is the next best step. Also agree to continue her light duty lifting restrictions for another month. She will update after PT sessions.

## 2018-11-20 DIAGNOSIS — Z3041 Encounter for surveillance of contraceptive pills: Secondary | ICD-10-CM

## 2018-11-21 MED ORDER — LO LOESTRIN FE 1 MG-10 MCG / 10 MCG PO TABS: 1.0000 | ORAL_TABLET | Freq: Every day | ORAL | 1 refills | Status: DC

## 2018-11-28 DIAGNOSIS — M542 Cervicalgia: Secondary | ICD-10-CM | POA: Diagnosis not present

## 2018-12-11 DIAGNOSIS — M531 Cervicobrachial syndrome: Secondary | ICD-10-CM | POA: Diagnosis not present

## 2018-12-19 ENCOUNTER — Encounter: Payer: Self-pay | Admitting: Primary Care

## 2018-12-24 DIAGNOSIS — Z20828 Contact with and (suspected) exposure to other viral communicable diseases: Secondary | ICD-10-CM | POA: Diagnosis not present

## 2018-12-24 DIAGNOSIS — R0989 Other specified symptoms and signs involving the circulatory and respiratory systems: Secondary | ICD-10-CM | POA: Diagnosis not present

## 2018-12-27 DIAGNOSIS — M531 Cervicobrachial syndrome: Secondary | ICD-10-CM | POA: Diagnosis not present

## 2019-01-06 ENCOUNTER — Other Ambulatory Visit: Payer: Self-pay | Admitting: Primary Care

## 2019-01-16 DIAGNOSIS — M5412 Radiculopathy, cervical region: Secondary | ICD-10-CM | POA: Diagnosis not present

## 2019-01-27 DIAGNOSIS — M5412 Radiculopathy, cervical region: Secondary | ICD-10-CM | POA: Diagnosis not present

## 2019-02-13 ENCOUNTER — Other Ambulatory Visit: Payer: Self-pay | Admitting: Primary Care

## 2019-02-13 DIAGNOSIS — Z3041 Encounter for surveillance of contraceptive pills: Secondary | ICD-10-CM

## 2019-02-13 MED ORDER — LO LOESTRIN FE 1 MG-10 MCG / 10 MCG PO TABS: 1.0000 | ORAL_TABLET | Freq: Every day | ORAL | 1 refills | Status: DC

## 2019-02-24 ENCOUNTER — Ambulatory Visit (INDEPENDENT_AMBULATORY_CARE_PROVIDER_SITE_OTHER): Payer: BC Managed Care – PPO | Admitting: Primary Care

## 2019-02-24 ENCOUNTER — Other Ambulatory Visit: Payer: Self-pay

## 2019-02-24 DIAGNOSIS — M542 Cervicalgia: Secondary | ICD-10-CM | POA: Diagnosis not present

## 2019-02-24 DIAGNOSIS — G8929 Other chronic pain: Secondary | ICD-10-CM | POA: Diagnosis not present

## 2019-02-24 NOTE — Progress Notes (Signed)
Subjective:    Patient ID: Laurie Burns, female    DOB: 1995-10-12, 23 y.o.   MRN: [id number]  HPI  Virtual Visit via Video Note  I connected with Laurie Burns on 02/24/19 at  2:40 PM EST by a video enabled telemedicine application and verified that I am speaking with the correct person using two identifiers.  Location: Patient: Parking Lot Provider: Office   I discussed the limitations of evaluation and management by telemedicine and the availability of in person appointments. The patient expressed understanding and agreed to proceed.  History of Present Illness:  [name] is a 23 year old female with a history of migraines, chronic shoulder pain, anxiety and depression who presents today for follow up.  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 Initially placed on lifting restrictions on October 10, 2018 per employee health, has been on restrictions since. During her last visit we referred her to physical therapy given continued symptoms.   Since her last visit she's completed physical therapy, didn't feel as though it was helpful. She's been seeing orthopedics through [facility] with her last visit being in mid October 2020. She underwent cervical spine xray which showed mild C5-6 level changes. 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 Mild left C6-7 foramina narrowing.   She's really unsure of the plan, her orthopedist refuses to write her restrictions as she was told to "contact my PCP for restrictions". She was supposed to be seen a week ago for re-evaluation, showed up for her appointment which was never actually scheduled in their system. She's not sure if she'll undergo injections, this was discussed during a prior visit. She's tried muscle relaxer's and prednisone without improvement. She was afraid to try gabapentin due to the higher initial dose.   She continues to experience  lower cervical spine pain with decrease in ROM due to pain. She's also experiencing right upper extremity numbness at times. A few weekends ago she tried to help her mother unload Christmas decorations and this caused back spasms and pain for several days.   She's very frustrated with her lack of improvement is is ready for a second opinion. She hasn't worked since October 10, 2018 and is asking for an extension of her restrictions until she's seen by another orthopedist.     Observations/Objective:  Alert and oriented. Appears well, not sickly. No distress. Speaking in complete sentences.   Assessment and Plan:  MRI with DDD which could be contributing, may be another underlying cause. Given lack of improvement despite conservative therapy we will refer her to orthopedics for a second opinion.  Dicussed that I can extend her restrictions for another month but she will need the future orthopedist to provide these instructions moving forward.   Referral placed.    Follow Up Instructions:  You will be contacted regarding your referral to orthopedics.  Please let us know if you have not been contacted within two weeks.   It was a pleasure to see you today! Mayra Reel, NP-C    I discussed the assessment and treatment plan with the patient. The patient was provided an opportunity to ask questions and all were answered. The patient agreed with the plan and demonstrated an understanding of the instructions.   The patient was advised to call back or seek an in-person evaluation if the symptoms worsen or if the condition fails to improve as  anticipated.    Pleas Koch, NP    Review of Systems  Musculoskeletal: Positive for arthralgias, back pain and neck pain.  Skin: Negative for color change.  Neurological: Positive for numbness.       No past medical history on file.   Social History   Socioeconomic History  . Marital status: Single    Spouse name: Not on file  . Number  of children: Not on file  . Years of education: Not on file  . Highest education level: Not on file  Occupational History  . Not on file  Social Needs  . Financial resource strain: Not on file  . Food insecurity    Worry: Not on file    Inability: Not on file  . Transportation needs    Medical: Not on file    Non-medical: Not on file  Tobacco Use  . Smoking status: Never Smoker  . Smokeless tobacco: Never Used  Substance and Sexual Activity  . Alcohol use: No  . Drug use: Not on file  . Sexual activity: Not on file  Lifestyle  . Physical activity    Days per week: Not on file    Minutes per session: Not on file  . Stress: Not on file  Relationships  . Social Herbalist on phone: Not on file    Gets together: Not on file    Attends religious service: Not on file    Active member of club or organization: Not on file    Attends meetings of clubs or organizations: Not on file    Relationship status: Not on file  . Intimate partner violence    Fear of current or ex partner: Not on file    Emotionally abused: Not on file    Physically abused: Not on file    Forced sexual activity: Not on file  Other Topics Concern  . Not on file  Social History Narrative   Graduated at [employer].   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    No past surgical history on file.  No family history on file.  No Known Allergies  Current Outpatient Medications on File Prior to Visit  Medication Sig Dispense Refill  . FLUoxetine (PROZAC) 20 MG capsule TAKE 1 CAPSULE BY MOUTH EVERY DAY FOR ANXIETY AND DEPRESSION 30 capsule 2  . FLUoxetine (PROZAC) 20 MG tablet Take 1 tablet (20 mg total) by mouth daily. For anxiety and depression. 90 tablet 0  . LO LOESTRIN FE 1 MG-10 MCG / 10 MCG tablet Take 1 tablet by mouth daily. 3 Package 1  . SUMAtriptan (IMITREX) 100 MG tablet Take 100 mg by mouth every 2 (two) hours as needed. May repeat in 2 hours if headache persists or recurs.     No current  facility-administered medications on file prior to visit.     There were no vitals taken for this visit. '  Objective:   Physical Exam  Constitutional: She is oriented to person, place, and time. She appears well-nourished.  Respiratory: Effort normal.  Neurological: She is alert and oriented to person, place, and time.           Assessment & Plan:

## 2019-02-24 NOTE — Assessment & Plan Note (Signed)
Since early July 2020. Has completed PT and is following with orthopedics through [facility].   MRI with DDD which could be contributing, may be another underlying cause. Given lack of improvement despite conservative therapy we will refer her to orthopedics for a second opinion.  Dicussed that I can extend her restrictions for another month but she will need the future orthopedist to provide these instructions moving forward.   Referral placed.

## 2019-02-24 NOTE — Patient Instructions (Signed)
You will be contacted regarding your referral to orthopedics.  Please let us know if you have not been contacted within two weeks.   It was a pleasure to see you today! Allie Bossier, NP-C

## 2019-03-31 DIAGNOSIS — N938 Other specified abnormal uterine and vaginal bleeding: Secondary | ICD-10-CM | POA: Diagnosis not present

## 2019-03-31 DIAGNOSIS — R102 Pelvic and perineal pain: Secondary | ICD-10-CM | POA: Diagnosis not present

## 2019-03-31 DIAGNOSIS — Z3040 Encounter for surveillance of contraceptives, unspecified: Secondary | ICD-10-CM | POA: Diagnosis not present

## 2019-04-21 DIAGNOSIS — Z3202 Encounter for pregnancy test, result negative: Secondary | ICD-10-CM | POA: Diagnosis not present

## 2019-04-21 DIAGNOSIS — Z30430 Encounter for insertion of intrauterine contraceptive device: Secondary | ICD-10-CM | POA: Diagnosis not present

## 2019-05-02 ENCOUNTER — Other Ambulatory Visit: Payer: Self-pay | Admitting: Primary Care

## 2019-05-29 ENCOUNTER — Emergency Department (HOSPITAL_COMMUNITY)
Admission: EM | Admit: 2019-05-29 | Discharge: 2019-05-29 | Disposition: A | Discharge status: Home / Self Care | Payer: PRIVATE HEALTH INSURANCE | Attending: Emergency Medicine | Admitting: Emergency Medicine

## 2019-05-29 DIAGNOSIS — S0990XA Unspecified injury of head, initial encounter: Secondary | ICD-10-CM

## 2019-05-29 DIAGNOSIS — R42 Dizziness and giddiness: Secondary | ICD-10-CM | POA: Insufficient documentation

## 2019-05-29 DIAGNOSIS — Y9301 Activity, walking, marching and hiking: Secondary | ICD-10-CM | POA: Diagnosis not present

## 2019-05-29 DIAGNOSIS — R110 Nausea: Secondary | ICD-10-CM | POA: Insufficient documentation

## 2019-05-29 DIAGNOSIS — Z79899 Other long term (current) drug therapy: Secondary | ICD-10-CM | POA: Diagnosis not present

## 2019-05-29 DIAGNOSIS — Y9289 Other specified places as the place of occurrence of the external cause: Secondary | ICD-10-CM | POA: Insufficient documentation

## 2019-05-29 DIAGNOSIS — Y990 Civilian activity done for income or pay: Secondary | ICD-10-CM | POA: Diagnosis not present

## 2019-05-29 DIAGNOSIS — S060X0A Concussion without loss of consciousness, initial encounter: Secondary | ICD-10-CM | POA: Diagnosis not present

## 2019-05-29 DIAGNOSIS — W228XXA Striking against or struck by other objects, initial encounter: Secondary | ICD-10-CM | POA: Diagnosis not present

## 2019-05-29 MED ORDER — NAPROXEN 500 MG PO TABS: 500.0000 mg | ORAL_TABLET | Freq: Two times a day (BID) | ORAL | 0 refills | Status: DC

## 2019-05-29 NOTE — Discharge Instructions (Signed)
Turn to the emergency department for vomiting, seizures, numbness or weakness or changes in vision, please read the attached instructions regarding concussions, naproxen or ibuprofen as needed for pain, avoid excessive light or sound and rest for the next 2 days

## 2019-05-29 NOTE — ED Provider Notes (Signed)
[facility] EMERGENCY DEPARTMENT Provider Note   CSN: [id number] Arrival date & time: 05/29/19  6606     History No chief complaint on file.   Austin Miles is a 24 y.o. female.  HPI   This patient is a 24 year old female, she has no significant chronic medical history other than anxiety for which she takes fluoxetine.  She presents to the hospital today with a complaint of a head injury which occurred while she was at work walking down the hallway pushing an orthopedic bed that had a metal bar, the bed stopped abruptly but she kept walking and struck the left forehead into this bar causing acute onset of headache, dizziness, nausea and a feeling like her vision was intermittently blurry in the left eye.  She was able to drive herself here to this hospital for evaluation able to ambulate into the building with normal balance and just feels like she had "seeing stars".  She denies any history of head injury or concussion in the past.  At this time her headache is slightly improved, there is no loss of consciousness, no seizure activity and no vomiting.  No medications given prior to arrival.  This occurred just prior to arrival at her job.  She works as an Scientist, research (physical sciences)  No past medical history on file.  Patient Active Problem List   Diagnosis Date Noted  . Chronic neck pain 02/24/2019  . Acute shoulder pain 11/15/2018  . Generalized abdominal pain 07/02/2018  . Anxiety and depression 12/28/2017  . Migraines 12/28/2017    No past surgical history on file.   OB History   No obstetric history on file.     No family history on file.  Social History   Tobacco Use  . Smoking status: Never Smoker  . Smokeless tobacco: Never Used  Substance Use Topics  . Alcohol use: No  . Drug use: Not on file    Home Medications Prior to Admission medications   Medication Sig Start Date End Date Taking? Authorizing Provider  FLUoxetine (PROZAC)  20 MG capsule TAKE 1 CAPSULE BY MOUTH EVERY DAY FOR ANXIETY AND DEPRESSION 05/02/19   Doreene Nest, NP  FLUoxetine (PROZAC) 20 MG tablet Take 1 tablet (20 mg total) by mouth daily. For anxiety and depression. 10/10/18   Doreene Nest, NP  LO LOESTRIN FE 1 MG-10 MCG / 10 MCG tablet Take 1 tablet by mouth daily. 02/13/19   Doreene Nest, NP  naproxen (NAPROSYN) 500 MG tablet Take 1 tablet (500 mg total) by mouth 2 (two) times daily with a meal. 05/29/19   Eber Hong, MD  SUMAtriptan (IMITREX) 100 MG tablet Take 100 mg by mouth every 2 (two) hours as needed. May repeat in 2 hours if headache persists or recurs.    [provider]    Allergies    Patient has no known allergies.  Review of Systems   Review of Systems  All other systems reviewed and are negative.   Physical Exam Updated Vital Signs BP 119/90 (BP Location: Right Arm)   Pulse 72   Resp 16   Ht 1.753 m (5\' 9" )   Wt 83.9 kg   SpO2 99%   BMI 27.32 kg/m   Physical Exam Vitals and nursing note reviewed.  Constitutional:      General: She is not in acute distress.    Appearance: She is well-developed.  HENT:     Head: Normocephalic.  Comments: Very small contusion to the left upper eyebrow, no visible swelling, no periorbital ecchymoses, mild tenderness over the orbital rim, normal extraocular movements    Mouth/Throat:     Pharynx: No oropharyngeal exudate.     Comments: Clear oropharynx, no dental trauma, no malocclusion Eyes:     General: No scleral icterus.       Right eye: No discharge.        Left eye: No discharge.     Conjunctiva/sclera: Conjunctivae normal.     Pupils: Pupils are equal, round, and reactive to light.     Comments: Normal pupils and extraocular movements, normal ability to elevate the eyelids.  Specifically there is no ptosis, there is no hyphema  Neck:     Thyroid: No thyromegaly.     Vascular: No JVD.  Cardiovascular:     Rate and Rhythm: Normal rate and regular  rhythm.     Heart sounds: Normal heart sounds. No murmur. No friction rub. No gallop.   Pulmonary:     Effort: Pulmonary effort is normal. No respiratory distress.     Breath sounds: Normal breath sounds. No wheezing or rales.  Abdominal:     General: Bowel sounds are normal. There is no distension.     Palpations: Abdomen is soft. There is no mass.     Tenderness: There is no abdominal tenderness.  Musculoskeletal:        General: No tenderness. Normal range of motion.     Cervical back: Normal range of motion and neck supple.  Lymphadenopathy:     Cervical: No cervical adenopathy.  Skin:    General: Skin is warm and dry.     Findings: No erythema or rash.  Neurological:     Mental Status: She is alert.     Coordination: Coordination normal.     Comments: Speech is clear, cranial nerves III through XII are intact, memory is intact, strength is normal in all 4 extremities including grips, sensation is intact to light touch and pinprick in all 4 extremities. Coordination as tested by finger-nose-finger is normal, no limb ataxia. Normal gait  Psychiatric:        Behavior: Behavior normal.     ED Results / Procedures / Treatments   Labs (all labs ordered are listed, but only abnormal results are displayed) Labs Reviewed - No data to display  EKG None  Radiology No results found.  Procedures Procedures (including critical care time)  Medications Ordered in ED Medications - No data to display  ED Course  I have reviewed the triage vital signs and the nursing notes.  Pertinent labs & imaging results that were available during my care of the patient were reviewed by me and considered in my medical decision making (see chart for details).    MDM Rules/Calculators/A&P                      Patient is well-appearing, stable for discharge, mild concussion, no signs of focal neurologic injuries and this minor trauma likely can be treated at home with supportive therapy, the  patient is agreeable and understands indications for return.  Final Clinical Impression(s) / ED Diagnoses Final diagnoses:  Concussion without loss of consciousness, initial encounter  Minor head injury, initial encounter    Rx / DC Orders ED Discharge Orders         Ordered    naproxen (NAPROSYN) 500 MG tablet  2 times daily with meals  05/29/19 6606           Eber Hong, MD 05/29/19 [phone]

## 2019-06-02 DIAGNOSIS — Z30431 Encounter for routine checking of intrauterine contraceptive device: Secondary | ICD-10-CM | POA: Diagnosis not present

## 2019-07-08 ENCOUNTER — Other Ambulatory Visit: Payer: Self-pay

## 2019-07-08 ENCOUNTER — Encounter: Payer: Self-pay | Admitting: Family Medicine

## 2019-07-08 ENCOUNTER — Ambulatory Visit (INDEPENDENT_AMBULATORY_CARE_PROVIDER_SITE_OTHER): Payer: No Typology Code available for payment source | Admitting: Family Medicine

## 2019-07-08 VITALS — BP 92/60 | HR 96 | Temp 99.0°F | Ht 68.0 in | Wt 204.2 lb

## 2019-07-08 DIAGNOSIS — G2581 Restless legs syndrome: Secondary | ICD-10-CM

## 2019-07-08 MED ORDER — ROPINIROLE HCL 0.25 MG PO TABS: 0.2500 mg | ORAL_TABLET | Freq: Every day | ORAL | 1 refills | Status: DC

## 2019-07-08 NOTE — Patient Instructions (Signed)
Please stop at the lab to have labs drawn.  Start low dose ropinirole at bedtime for restless leg.  Increase water intake.

## 2019-07-08 NOTE — Progress Notes (Signed)
Chief Complaint  Patient presents with  . Restless Legs    History of Present Illness: HPI   24 year old female patient of Tawni Millers presents with new onset  Restless leg syndrome.   She reports she has noted in last 3-4 months... when lying in bed, resting... legs feel restless, cannot get comfortable.  Getting up to work resolves it. Legs feel numb and achy like.  Legs jump and creepy crawly feeling.  She is on her feet all day at work.. 12 hour shifts at [employer]. No swelling.   She has tried hot baths, heating pads, cold packs, TENs unit, elevating legs, compression.   Mom with RLS when pregnant. Took mothers gabapentin and this helped.  No new meds. Has IUD.   This visit occurred during the SARS-CoV-2 public health emergency.  Safety protocols were in place, including screening questions prior to the visit, additional usage of staff PPE, and extensive cleaning of exam room while observing appropriate contact time as indicated for disinfecting solutions.   COVID 19 screen:  No recent travel or known exposure to COVID19 The patient denies respiratory symptoms of COVID 19 at this time. The importance of social distancing was discussed today.     Review of Systems  Constitutional: Negative for chills and fever.  HENT: Negative for congestion and ear pain.   Eyes: Negative for pain and redness.  Respiratory: Negative for cough and shortness of breath.   Cardiovascular: Negative for chest pain, palpitations and leg swelling.  Gastrointestinal: Negative for abdominal pain, blood in stool, constipation, diarrhea, nausea and vomiting.  Genitourinary: Negative for dysuria.  Musculoskeletal: Negative for falls and myalgias.  Skin: Negative for rash.  Neurological: Negative for dizziness.  Psychiatric/Behavioral: Negative for depression. The patient is not nervous/anxious.       History reviewed. No pertinent past medical history.  reports that she has never smoked.  She has never used smokeless tobacco. She reports that she does not drink alcohol.   Current Outpatient Medications:  .  FLUoxetine (PROZAC) 20 MG capsule, TAKE 1 CAPSULE BY MOUTH EVERY DAY FOR ANXIETY AND DEPRESSION, Disp: 30 capsule, Rfl: 5 .  levonorgestrel (MIRENA, 52 MG,) 20 MCG/24HR IUD, Mirena 20 mcg/24 hours (6 yrs) 52 mg intrauterine device  Take 1 device by intrauterine route., Disp: , Rfl:    Observations/Objective: Blood pressure 92/60, pulse 96, temperature 99 F (37.2 C), temperature source Temporal, height 5\' 8"  (1.727 m), weight 204 lb 4 oz (92.6 kg), SpO2 98 %.  Physical Exam Constitutional:      General: She is not in acute distress.    Appearance: Normal appearance. She is well-developed. She is not ill-appearing or toxic-appearing.  HENT:     Head: Normocephalic.     Right Ear: Hearing, tympanic membrane, ear canal and external ear normal. Tympanic membrane is not erythematous, retracted or bulging.     Left Ear: Hearing, tympanic membrane, ear canal and external ear normal. Tympanic membrane is not erythematous, retracted or bulging.     Nose: No mucosal edema or rhinorrhea.     Right Sinus: No maxillary sinus tenderness or frontal sinus tenderness.     Left Sinus: No maxillary sinus tenderness or frontal sinus tenderness.     Mouth/Throat:     Pharynx: Uvula midline.  Eyes:     General: Lids are normal. Lids are everted, no foreign bodies appreciated.     Conjunctiva/sclera: Conjunctivae normal.     Pupils: Pupils are equal, round, and reactive  to light.  Neck:     Thyroid: No thyroid mass or thyromegaly.     Vascular: No carotid bruit.     Trachea: Trachea normal.  Cardiovascular:     Rate and Rhythm: Normal rate and regular rhythm.     Pulses: Normal pulses.     Heart sounds: Normal heart sounds, S1 normal and S2 normal. No murmur. No friction rub. No gallop.   Pulmonary:     Effort: Pulmonary effort is normal. No tachypnea or respiratory distress.      Breath sounds: Normal breath sounds. No decreased breath sounds, wheezing, rhonchi or rales.  Abdominal:     General: Bowel sounds are normal.     Palpations: Abdomen is soft.     Tenderness: There is no abdominal tenderness.  Musculoskeletal:     Cervical back: Normal range of motion and neck supple.  Skin:    General: Skin is warm and dry.     Findings: No rash.  Neurological:     Mental Status: She is alert and oriented to person, place, and time.     GCS: GCS eye subscore is 4. GCS verbal subscore is 5. GCS motor subscore is 6.     Cranial Nerves: No cranial nerve deficit.     Sensory: No sensory deficit.     Motor: No abnormal muscle tone.     Coordination: Coordination normal.     Gait: Gait normal.     Deep Tendon Reflexes: Reflexes are normal and symmetric.     Comments: Nml cerebellar exam   No papilledema  Psychiatric:        Mood and Affect: Mood is not anxious or depressed.        Speech: Speech normal.        Behavior: Behavior normal. Behavior is cooperative.        Thought Content: Thought content normal.        Cognition and Memory: Memory is not impaired. She does not exhibit impaired recent memory or impaired remote memory.        Judgment: Judgment normal.      Assessment and Plan   Restless leg syndrome Eval for secondary causes.  Start trial of requip.     Kerby Nora, MD

## 2019-07-09 LAB — COMPREHENSIVE METABOLIC PANEL
ALT: 20 U/L (ref 0–35)
AST: 22 U/L (ref 0–37)
Albumin: 4.2 g/dL (ref 3.5–5.2)
Alkaline Phosphatase: 65 U/L (ref 39–117)
BUN: 23 mg/dL (ref 6–23)
CO2: 27 mEq/L (ref 19–32)
Calcium: 9.6 mg/dL (ref 8.4–10.5)
Chloride: 104 mEq/L (ref 96–112)
Creatinine, Ser: 0.75 mg/dL (ref 0.40–1.20)
GFR: 94.38 mL/min (ref >60.00)
Glucose, Bld: 67 mg/dL — ABNORMAL LOW (ref 70–99)
Potassium: 4.2 mEq/L (ref 3.5–5.1)
Sodium: 140 mEq/L (ref 135–145)
Total Bilirubin: 0.4 mg/dL (ref 0.2–1.2)
Total Protein: 7 g/dL (ref 6.0–8.3)

## 2019-07-09 LAB — VITAMIN D 25 HYDROXY (VIT D DEFICIENCY, FRACTURES): VITD: 24.97 ng/mL — ABNORMAL LOW (ref 30.00–100.00)

## 2019-07-09 LAB — CBC WITH DIFFERENTIAL/PLATELET
Basophils Absolute: 0.1 10*3/uL (ref 0.0–0.1)
Basophils Relative: 1 % (ref 0.0–3.0)
Eosinophils Absolute: 0.1 10*3/uL (ref 0.0–0.7)
Eosinophils Relative: 1.4 % (ref 0.0–5.0)
HCT: 37.2 % (ref 36.0–46.0)
Hemoglobin: 12.3 g/dL (ref 12.0–15.0)
Lymphocytes Relative: 24.5 % (ref 12.0–46.0)
Lymphs Abs: 2 10*3/uL (ref 0.7–4.0)
MCHC: 33 g/dL (ref 30.0–36.0)
MCV: 89.5 fl (ref 78.0–100.0)
Monocytes Absolute: 0.9 10*3/uL (ref 0.1–1.0)
Monocytes Relative: 10.7 % (ref 3.0–12.0)
Neutro Abs: 5.2 10*3/uL (ref 1.4–7.7)
Neutrophils Relative %: 62.4 % (ref 43.0–77.0)
Platelets: 225 10*3/uL (ref 150.0–400.0)
RBC: 4.16 Mil/uL (ref 3.87–5.11)
RDW: 13 % (ref 11.5–15.5)
WBC: 8.3 10*3/uL (ref 4.0–10.5)

## 2019-07-09 LAB — IBC + FERRITIN
Ferritin: 15 ng/mL (ref 10.0–291.0)
Iron: 48 ug/dL (ref 42–145)
Saturation Ratios: 11 % — ABNORMAL LOW (ref 20.0–50.0)
Transferrin: 313 mg/dL (ref 212.0–360.0)

## 2019-07-09 LAB — TSH: TSH: 1.04 u[IU]/mL (ref 0.35–4.50)

## 2019-07-09 LAB — VITAMIN B12: Vitamin B-12: 296 pg/mL (ref 211–911)

## 2019-07-15 DIAGNOSIS — B354 Tinea corporis: Secondary | ICD-10-CM | POA: Diagnosis not present

## 2019-07-15 DIAGNOSIS — R102 Pelvic and perineal pain: Secondary | ICD-10-CM | POA: Diagnosis not present

## 2019-07-30 DIAGNOSIS — G2581 Restless legs syndrome: Secondary | ICD-10-CM | POA: Insufficient documentation

## 2019-07-30 NOTE — Assessment & Plan Note (Signed)
Eval for secondary causes.  Start trial of requip.

## 2019-09-10 ENCOUNTER — Ambulatory Visit (INDEPENDENT_AMBULATORY_CARE_PROVIDER_SITE_OTHER): Payer: No Typology Code available for payment source | Admitting: Orthopaedic Surgery

## 2019-09-10 ENCOUNTER — Ambulatory Visit: Payer: Self-pay

## 2019-09-10 ENCOUNTER — Other Ambulatory Visit: Payer: Self-pay | Admitting: Occupational Medicine

## 2019-09-10 ENCOUNTER — Other Ambulatory Visit: Payer: Self-pay

## 2019-09-10 DIAGNOSIS — M79641 Pain in right hand: Secondary | ICD-10-CM

## 2019-09-10 DIAGNOSIS — M79644 Pain in right finger(s): Secondary | ICD-10-CM

## 2019-09-10 MED ORDER — IBUPROFEN 800 MG PO TABS: 800.0000 mg | ORAL_TABLET | Freq: Three times a day (TID) | ORAL | 2 refills | Status: DC | PRN

## 2019-09-10 MED ORDER — HYDROCODONE-ACETAMINOPHEN 5-325 MG PO TABS: 1.0000 | ORAL_TABLET | Freq: Three times a day (TID) | ORAL | 0 refills | Status: DC | PRN

## 2019-09-10 MED ORDER — AMOXICILLIN-POT CLAVULANATE 875-125 MG PO TABS: 1.0000 | ORAL_TABLET | Freq: Two times a day (BID) | ORAL | 0 refills | Status: DC

## 2019-09-10 NOTE — Progress Notes (Signed)
Office Visit Note   Patient: Laurie Burns           Date of Birth: 1995-07-17           MRN: [id number] Visit Date: 09/10/2019              Requested by: Pleas Koch, NP [place],   [id number] PCP: Pleas Koch, NP   Assessment & Plan: Visit Diagnoses:  1. Pain in right finger(s)     Plan: My impression is distal phalanx fracture of the right small finger.  We will place her on a week of Augmentin prophylactically.  Hydrocodone sent in for breakthrough pain.  Advil to take regularly.  Work note provided.  AlumaFoam splint was placed.  She will keep the nail on to protect the underlying nailbed.  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  I would like to recheck her in 2 weeks.  Follow-Up Instructions: Return in about 2 weeks (around 09/24/2019).   Orders:  No orders of the defined types were placed in this encounter.  Meds ordered this encounter  Medications  . HYDROcodone-acetaminophen (NORCO) 5-325 MG tablet    Sig: Take 1-2 tablets by mouth 3 (three) times daily as needed.    Dispense:  20 tablet    Refill:  0  . ibuprofen (ADVIL) 800 MG tablet    Sig: Take 1 tablet (800 mg total) by mouth every 8 (eight) hours as needed.    Dispense:  30 tablet    Refill:  2  . amoxicillin-clavulanate (AUGMENTIN) 875-125 MG tablet    Sig: Take 1 tablet by mouth 2 (two) times daily.    Dispense:  14 tablet    Refill:  0      Procedures: No procedures performed   Clinical Data: No additional findings.   Subjective: Chief Complaint  Patient presents with  . 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  She had immediate pain and swelling.  She was initially evaluated at health at work and x-ray showed a volarly angulated distal phalanx fracture with nail  injury.  She has significant pain in the fingertip.   Review of Systems  Constitutional: Negative.   HENT: Negative.   Eyes: Negative.   Respiratory: Negative.   Cardiovascular: Negative.   Endocrine: Negative.   Musculoskeletal: Negative.   Neurological: Negative.   Hematological: Negative.   Psychiatric/Behavioral: Negative.   All other systems reviewed and are negative.    Objective: Vital Signs: LMP 09/03/2019   Physical Exam Vitals and nursing note reviewed.  Constitutional:      Appearance: She is well-developed.  HENT:     Head: Normocephalic and atraumatic.  Pulmonary:     Effort: Pulmonary effort is normal.  Abdominal:     Palpations: Abdomen is soft.  Musculoskeletal:     Cervical back: Neck supple.  Skin:    General: Skin is warm.     Capillary Refill: Capillary refill takes less than 2 seconds.  Neurological:     Mental Status: She is alert and oriented to person, place, and time.  Psychiatric:        Behavior: Behavior normal.        Thought Content: Thought content  normal.        Judgment: Judgment normal.     Ortho Exam Right small finger shows moderate swelling with bruising and the fingertip is quite swollen and tender to touch.  No neurovascular compromise.  There is a vertical split of the fingernail.  It is still adhered to the underlying nail bed.  There is a nailbed hematoma.  Extensor and flexor tendons functions are intact. Specialty Comments:  No specialty comments available.  Imaging: DG Hand Complete Right  Result Date: 09/10/2019 CLINICAL DATA:  RIGHT hand pain post crush injury EXAM: RIGHT HAND - COMPLETE 3+ VIEW COMPARISON:  None FINDINGS: Osseous mineralization normal. Joint spaces preserved. Displaced fracture at tuft of distal phalanx RIGHT little finger with mild apex dorsal angulation. No additional fracture, dislocation, or bone destruction. IMPRESSION: Displaced and mildly angulated fracture at tuft of distal phalanx RIGHT  little finger. Electronically Signed   By: Ulyses Southward M.D.   On: 09/10/2019 15:03     PMFS History: Patient Active Problem List   Diagnosis Date Noted  . Restless leg syndrome 07/30/2019  . Anxiety and depression 12/28/2017  . Migraines 12/28/2017   No past medical history on file.  No family history on file.  No past surgical history on file. Social History   Occupational History  . Not on file  Tobacco Use  . Smoking status: Never Smoker  . Smokeless tobacco: Never Used  Substance and Sexual Activity  . Alcohol use: No  . Drug use: Not on file  . Sexual activity: Not on file

## 2019-09-24 ENCOUNTER — Ambulatory Visit (INDEPENDENT_AMBULATORY_CARE_PROVIDER_SITE_OTHER): Payer: PRIVATE HEALTH INSURANCE | Admitting: Orthopaedic Surgery

## 2019-09-24 ENCOUNTER — Ambulatory Visit (INDEPENDENT_AMBULATORY_CARE_PROVIDER_SITE_OTHER): Payer: PRIVATE HEALTH INSURANCE

## 2019-09-24 DIAGNOSIS — M79644 Pain in right finger(s): Secondary | ICD-10-CM | POA: Diagnosis not present

## 2019-09-24 NOTE — Progress Notes (Signed)
   Office Visit Note   Patient: Laurie Burns           Date of Birth: November 11, 1995           MRN: [id number] Visit Date: 09/24/2019              Requested by: Doreene Nest, NP [address] [place],  [place] [address] PCP: Doreene Nest, NP   Assessment & Plan: Visit Diagnoses:  1. Pain in right finger(s)     Plan: Impression is displaced phalangeal fracture of the right small finger.  We will continue to immobilize in a volar AlumaFoam splint.  She is to remain out of work until follow-up in 4 weeks.  She could go back to light duty if it is available  Follow-Up Instructions: Return in about 4 weeks (around 10/22/2019).   Orders:  Orders Placed This Encounter  Procedures  . XR Finger Little Right   No orders of the defined types were placed in this encounter.     Procedures: No procedures performed   Clinical Data: No additional findings.   Subjective: Chief Complaint  Patient presents with  . Right Little Finger - Follow-up, Fracture    OTJI 09/10/2019    Laurie Burns returns today for follow-up of her small finger distal phalanx fracture.  Overall doing better.  She has been wearing the splint at all times.  She is currently out of work.  She finished her Augmentin.  She has no occasional dull aching pain.   Review of Systems   Objective: Vital Signs: LMP 09/03/2019   Physical Exam  Ortho Exam The swelling is significantly improved in the small finger.  Fingertip slightly is volar angulated.  No neurovascular compromise.  Nailbed is intact. Specialty Comments:  No specialty comments available.  Imaging: No results found.   PMFS History: Patient Active Problem List   Diagnosis Date Noted  . Restless leg syndrome 07/30/2019  . Anxiety and depression 12/28/2017  . Migraines 12/28/2017   No past medical history on file.  No family history on file.  No past surgical history on file. Social History   Occupational History  . Not on file    Tobacco Use  . Smoking status: Never Smoker  . Smokeless tobacco: Never Used  Substance and Sexual Activity  . Alcohol use: No  . Drug use: Not on file  . Sexual activity: Not on file

## 2019-10-28 ENCOUNTER — Ambulatory Visit (INDEPENDENT_AMBULATORY_CARE_PROVIDER_SITE_OTHER): Payer: BC Managed Care – PPO | Admitting: Orthopaedic Surgery

## 2019-10-28 ENCOUNTER — Encounter: Payer: Self-pay | Admitting: Orthopaedic Surgery

## 2019-10-28 VITALS — Ht 69.0 in | Wt 202.4 lb

## 2019-10-28 DIAGNOSIS — M79644 Pain in right finger(s): Secondary | ICD-10-CM | POA: Insufficient documentation

## 2019-10-28 NOTE — Progress Notes (Signed)
Patient ID: Laurie Burns, female   DOB: January 28, 1996, 24 y.o.   MRN: [id number]  [name] returns today for follow-up of her right small finger injury.  Overall doing well.  Sensation is slowly coming back.  Overall doing well.  The finger is nontender nonswollen.  The nail is coming off.  At this point I feel that it is okay for her to return back to work on August 2 doing performing her usual duties.  I would like to recheck her in 6 weeks.

## 2019-11-27 ENCOUNTER — Telehealth: Payer: BC Managed Care – PPO | Admitting: Nurse Practitioner

## 2019-11-27 ENCOUNTER — Telehealth: Payer: Self-pay | Admitting: Primary Care

## 2019-11-27 DIAGNOSIS — J069 Acute upper respiratory infection, unspecified: Secondary | ICD-10-CM | POA: Diagnosis not present

## 2019-11-27 MED ORDER — FLUTICASONE PROPIONATE 50 MCG/ACT NA SUSP
2.0000 | Freq: Every day | NASAL | 6 refills | Status: DC
Start: 2019-11-27 — End: 2020-06-28

## 2019-11-27 NOTE — Progress Notes (Signed)
We are sorry you are not feeling well.  Here is how we plan to help!  Based on what you have shared with me, it looks like you may have a viral upper respiratory infection.  Upper respiratory infections are caused by a large number of viruses; however, rhinovirus is the most common cause.   Symptoms vary from person to person, with common symptoms including sore throat, cough, fatigue or lack of energy and feeling of general discomfort.  A low-grade fever of up to 100.4 may present, but is often uncommon.  Symptoms vary however, and are closely related to a person's age or underlying illnesses.  The most common symptoms associated with an upper respiratory infection are nasal discharge or congestion, cough, sneezing, headache and pressure in the ears and face.  These symptoms usually persist for about 3 to 10 days, but can last up to 2 weeks.  It is important to know that upper respiratory infections do not cause serious illness or complications in most cases.    Upper respiratory infections can be transmitted from person to person, with the most common method of transmission being a person's hands.  The virus is able to live on the skin and can infect other persons for up to 2 hours after direct contact.  Also, these can be transmitted when someone coughs or sneezes; thus, it is important to cover the mouth to reduce this risk.  To keep the spread of the illness at bay, good hand hygiene is very important.  This is an infection that is most likely caused by a virus. There are no specific treatments other than to help you with the symptoms until the infection runs its course.  We are sorry you are not feeling well.  Here is how we plan to help!   For nasal congestion, you may use an oral decongestants such as Mucinex D or if you have glaucoma or high blood pressure use plain Mucinex.  Saline nasal spray or nasal drops can help and can safely be used as often as needed for congestion.  For your congestion,  I have prescribed Fluticasone nasal spray one spray in each nostril twice a day  If you do not have a history of heart disease, hypertension, diabetes or thyroid disease, prostate/bladder issues or glaucoma, you may also use Sudafed to treat nasal congestion.  It is highly recommended that you consult with a pharmacist or your primary care physician to ensure this medication is safe for you to take.     If you have a cough, you may use cough suppressants such as Delsym and Robitussin.  If you have glaucoma or high blood pressure, you can also use Coricidin HBP.    If you have a sore or scratchy throat, use a saltwater gargle-  to  teaspoon of salt dissolved in a 4-ounce to 8-ounce glass of warm water.  Gargle the solution for approximately 15-30 seconds and then spit.  It is important not to swallow the solution.  You can also use throat lozenges/cough drops and Chloraseptic spray to help with throat pain or discomfort.  Warm or cold liquids can also be helpful in relieving throat pain.  For headache, pain or general discomfort, you can use Ibuprofen or Tylenol as directed.   Some authorities believe that zinc sprays or the use of Echinacea may shorten the course of your symptoms.   HOME CARE . Only take medications as instructed by your medical team. . Be sure to drink plenty   of fluids. Water is fine as well as fruit juices, sodas and electrolyte beverages. You may want to stay away from caffeine or alcohol. If you are nauseated, try taking small sips of liquids. How do you know if you are getting enough fluid? Your urine should be a pale yellow or almost colorless. . Get rest. . Taking a steamy shower or using a humidifier may help nasal congestion and ease sore throat pain. You can place a towel over your head and breathe in the steam from hot water coming from a faucet. . Using a saline nasal spray works much the same way. . Cough drops, hard candies and sore throat lozenges may ease your  cough. . Avoid close contacts especially the very young and the elderly . Cover your mouth if you cough or sneeze . Always remember to wash your hands.   GET HELP RIGHT AWAY IF: . You develop worsening fever. . If your symptoms do not improve within 10 days . You develop yellow or green discharge from your nose over 3 days. . You have coughing fits . You develop a severe head ache or visual changes. . You develop shortness of breath, difficulty breathing or start having chest pain . Your symptoms persist after you have completed your treatment plan  MAKE SURE YOU   Understand these instructions.  Will watch your condition.  Will get help right away if you are not doing well or get worse.  Your e-visit answers were reviewed by a board certified advanced clinical practitioner to complete your personal care plan. Depending upon the condition, your plan could have included both over the counter or prescription medications. Please review your pharmacy choice. If there is a problem, you may call our nursing hot line at and have the prescription routed to another pharmacy. Your safety is important to us. If you have drug allergies check your prescription carefully.   You can use MyChart to ask questions about today's visit, request a non-urgent call back, or ask for a work or school excuse for 24 hours related to this e-Visit. If it has been greater than 24 hours you will need to follow up with your provider, or enter a new e-Visit to address those concerns. You will get an e-mail in the next two days asking about your experience.  I hope that your e-visit has been valuable and will speed your recovery. Thank you for using e-visits.   5-10 minutes spent reviewing and documenting in chart.    

## 2019-11-27 NOTE — Telephone Encounter (Signed)
Patient called into office to schedule visit with PCP. Patient has been having congestion and heads for a few days. Patient did go get tested for covid and tested negative 2 times. Patient scheduled for office visit Monday but said she is okay with switching to virtual if need be. Please advise if needing to be switched. Also patient stated her employer is needing to have a note stating she is going to be seen by provider as they dont want her to return till visit with PCP.

## 2019-11-27 NOTE — Telephone Encounter (Signed)
Given that she tested negative for Covid twice, I am comfortable with seeing her in the office if she prefers. I am fine with either virtual or in person, whichever is easier for her.

## 2019-12-01 ENCOUNTER — Encounter: Payer: Self-pay | Admitting: Primary Care

## 2019-12-01 ENCOUNTER — Other Ambulatory Visit: Payer: Self-pay

## 2019-12-01 ENCOUNTER — Ambulatory Visit (INDEPENDENT_AMBULATORY_CARE_PROVIDER_SITE_OTHER): Payer: No Typology Code available for payment source | Admitting: Primary Care

## 2019-12-01 VITALS — BP 108/68 | HR 78 | Temp 97.7°F | Ht 69.0 in | Wt 201.0 lb

## 2019-12-01 DIAGNOSIS — B9689 Other specified bacterial agents as the cause of diseases classified elsewhere: Secondary | ICD-10-CM | POA: Diagnosis not present

## 2019-12-01 DIAGNOSIS — J019 Acute sinusitis, unspecified: Secondary | ICD-10-CM | POA: Diagnosis not present

## 2019-12-01 DIAGNOSIS — K219 Gastro-esophageal reflux disease without esophagitis: Secondary | ICD-10-CM | POA: Insufficient documentation

## 2019-12-01 MED ORDER — BENZONATATE 200 MG PO CAPS: 200.0000 mg | ORAL_CAPSULE | Freq: Three times a day (TID) | ORAL | 0 refills | Status: DC | PRN

## 2019-12-01 MED ORDER — AMOXICILLIN-POT CLAVULANATE 875-125 MG PO TABS: 1.0000 | ORAL_TABLET | Freq: Two times a day (BID) | ORAL | 0 refills | Status: DC

## 2019-12-01 MED ORDER — OMEPRAZOLE 20 MG PO CPDR: 20.0000 mg | DELAYED_RELEASE_CAPSULE | Freq: Every day | ORAL | 0 refills | Status: DC

## 2019-12-01 NOTE — Assessment & Plan Note (Signed)
Acute symptoms x 7 days, worse today with sinus pressure/pain, nasal drainage.  Exam today with clear lungs, she's tested negative for Covid-19 several times. Rx for Augmentin course provided for presumed sinusitis. Rx for [employer] provided for cough.   She will update.

## 2019-12-01 NOTE — Patient Instructions (Signed)
Start Augmentin antibiotics for the infection Take 1 tablet by mouth twice daily for 7 days.  You may take Benzonatate capsules for cough. Take 1 capsule by mouth three times daily as needed for cough.  Take the omeprazole 20 mg capsules once nightly at bedtime.  Please update me regarding your heartburn symptoms.  It was a pleasure to see you today!

## 2019-12-01 NOTE — Progress Notes (Signed)
Subjective:    Patient ID: Laurie Burns, female    DOB: 12/20/1995, 24 y.o.   MRN: [id number]  HPI  This visit occurred during the SARS-CoV-2 public health emergency.  Safety protocols were in place, including screening questions prior to the visit, additional usage of staff PPE, and extensive cleaning of exam room while observing appropriate contact time as indicated for disinfecting solutions.   [name] is a 24 year old female with a history of migraines, anxiety and depression, restless legs who presents today with a chief complaint of chest and nasal congestion.  She also endorses cough, sinus pressure, chest pressure, fatigue, headache. Symptoms began one week ago.   She underwent an e-visit four days ago, diagnosed with viral URI and was told to do Mucinex and Flonase. She's been tested for Covid-19 twice, negative both times. She's completed both Covid-19 vaccines.   She's been doing Alka-Seltzer, Afrin (for a few days), Zyrtec with temporary improvement. Her most bothersome symptom is cough and congestion. Today she's beginning to feel worse, is expelling thick/green mucous from her nasal cavity, increased sinus pressure.   Also for the last several months she's struggled with esophageal burning and chest pressure. She's taken Pepto Bismol which provided temporary improvement, but no relief with Pepcid.   Review of Systems  Constitutional: Positive for chills and fatigue. Negative for fever.  HENT: Positive for congestion, sinus pressure and sinus pain.   Respiratory: Positive for cough.   Allergic/Immunologic: Positive for environmental allergies.       No past medical history on file.   Social History   Socioeconomic History  . Marital status: Single    Spouse name: Not on file  . Number of children: Not on file  . Years of education: Not on file  . Highest education level: Not on file  Occupational History  . Not on file  Tobacco Use  . Smoking status: Never  Smoker  . Smokeless tobacco: Never Used  Substance and Sexual Activity  . Alcohol use: No  . Drug use: Not on file  . Sexual activity: Not on file  Other Topics Concern  . Not on file  Social History Narrative   Graduated at [employer]   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   Social Determinants of Health   Financial Resource Strain:   . Difficulty of Paying Living Expenses: Not on file  Food Insecurity:   . Worried About Programme researcher, broadcasting/film/video in the Last Year: Not on file  . Ran Out of Food in the Last Year: Not on file  Transportation Needs:   . Lack of Transportation (Medical): Not on file  . Lack of Transportation (Non-Medical): Not on file  Physical Activity:   . Days of Exercise per Week: Not on file  . Minutes of Exercise per Session: Not on file  Stress:   . Feeling of Stress : Not on file  Social Connections:   . Frequency of Communication with Friends and Family: Not on file  . Frequency of Social Gatherings with Friends and Family: Not on file  . Attends Religious Services: Not on file  . Active Member of Clubs or Organizations: Not on file  . Attends Banker Meetings: Not on file  . Marital Status: Not on file  Intimate Partner Violence:   . Fear of Current or Ex-Partner: Not on file  . Emotionally Abused: Not on file  . Physically Abused: Not on file  . Sexually Abused: Not  on file    No past surgical history on file.  No family history on file.  No Known Allergies  Current Outpatient Medications on File Prior to Visit  Medication Sig Dispense Refill  . Phenyleph-CPM-DM-APAP (ALKA-SELTZER PLUS COLD & FLU PO) Take by mouth as directed.    Marland Kitchen FLUoxetine (PROZAC) 20 MG capsule TAKE 1 CAPSULE BY MOUTH EVERY DAY FOR ANXIETY AND DEPRESSION 30 capsule 5  . fluticasone (FLONASE) 50 MCG/ACT nasal spray Place 2 sprays into both nostrils daily. 16 g 6  . levonorgestrel (MIRENA, 52 MG,) 20 MCG/24HR IUD Mirena 20 mcg/24 hours (6 yrs) 52 mg intrauterine device  Take 1  device by intrauterine route.    Marland Kitchen rOPINIRole (REQUIP) 0.25 MG tablet Take 1 tablet (0.25 mg total) by mouth at bedtime. 30 tablet 1   No current facility-administered medications on file prior to visit.    BP 108/68   Pulse 78   Temp 97.7 F (36.5 C)   Ht 5\' 9"  (1.753 m)   Wt 201 lb (91.2 kg)   LMP  (LMP Unknown)   SpO2 98%   BMI 29.68 kg/m    Objective:   Physical Exam HENT:     Right Ear: Tympanic membrane and ear canal normal.     Left Ear: Tympanic membrane and ear canal normal.  Cardiovascular:     Rate and Rhythm: Normal rate and regular rhythm.  Pulmonary:     Effort: Pulmonary effort is normal.     Breath sounds: Normal breath sounds. No wheezing or rhonchi.  Musculoskeletal:     Cervical back: Neck supple.  Neurological:     Mental Status: She is alert.            Assessment & Plan:

## 2019-12-01 NOTE — Assessment & Plan Note (Signed)
Likely aggravated by cough from acute sinusitis.  No improvement with Pepcid, trial of omeprazole HS.  She will update.

## 2019-12-09 ENCOUNTER — Ambulatory Visit: Payer: BC Managed Care – PPO | Admitting: Orthopaedic Surgery

## 2019-12-17 DIAGNOSIS — H5213 Myopia, bilateral: Secondary | ICD-10-CM | POA: Diagnosis not present

## 2019-12-26 ENCOUNTER — Ambulatory Visit: Payer: Self-pay | Admitting: Podiatry

## 2020-01-01 ENCOUNTER — Other Ambulatory Visit: Payer: Self-pay

## 2020-01-01 DIAGNOSIS — K219 Gastro-esophageal reflux disease without esophagitis: Secondary | ICD-10-CM

## 2020-01-02 ENCOUNTER — Other Ambulatory Visit: Payer: Self-pay | Admitting: Primary Care

## 2020-01-02 MED ORDER — OMEPRAZOLE 20 MG PO CPDR: 20.0000 mg | DELAYED_RELEASE_CAPSULE | Freq: Every day | ORAL | 1 refills | Status: DC

## 2020-01-06 ENCOUNTER — Ambulatory Visit (INDEPENDENT_AMBULATORY_CARE_PROVIDER_SITE_OTHER): Payer: BC Managed Care – PPO

## 2020-01-06 ENCOUNTER — Other Ambulatory Visit: Payer: Self-pay

## 2020-01-06 ENCOUNTER — Ambulatory Visit: Payer: BC Managed Care – PPO | Admitting: Podiatry

## 2020-01-06 ENCOUNTER — Encounter: Payer: Self-pay | Admitting: Podiatry

## 2020-01-06 VITALS — BP 110/79 | HR 81 | Temp 98.4°F

## 2020-01-06 DIAGNOSIS — M2011 Hallux valgus (acquired), right foot: Secondary | ICD-10-CM | POA: Diagnosis not present

## 2020-01-06 DIAGNOSIS — M21619 Bunion of unspecified foot: Secondary | ICD-10-CM

## 2020-01-06 DIAGNOSIS — M21612 Bunion of left foot: Secondary | ICD-10-CM

## 2020-01-06 DIAGNOSIS — M2012 Hallux valgus (acquired), left foot: Secondary | ICD-10-CM | POA: Diagnosis not present

## 2020-01-06 DIAGNOSIS — M205X2 Other deformities of toe(s) (acquired), left foot: Secondary | ICD-10-CM

## 2020-01-06 DIAGNOSIS — M79675 Pain in left toe(s): Secondary | ICD-10-CM | POA: Diagnosis not present

## 2020-01-06 DIAGNOSIS — M21611 Bunion of right foot: Secondary | ICD-10-CM

## 2020-01-06 DIAGNOSIS — M205X1 Other deformities of toe(s) (acquired), right foot: Secondary | ICD-10-CM

## 2020-01-06 NOTE — Progress Notes (Signed)
  Subjective:  Patient ID: Laurie Burns, female    DOB: 1996/01/22,  MRN: [id number]  No chief complaint on file.  24 y.o. female presents with the above complaint. History confirmed with patient.  States that she was previously set up to have surgery in her bunions as she states that they are very painful and she has difficulty wearing normal shoe gear would like to consider possible scheduling of surgical intervention.  Objective:  Physical Exam: warm, good capillary refill, no trophic changes or ulcerative lesions, normal DP and PT pulses and normal sensory exam. Left Foot: Mild clinical hallux valgus deformity with prominent dorsal medial bunion and hallux interphalangeus deformity.  Some excessive laxity to the first tarsometatarsal Right Foot: Moderate clinical hallux valgus deformity with prominent dorsal medial bunion and hallux interphalangeus deformity.  Some excessive laxity to the first tarsometatarsal  No images are attached to the encounter.  Radiographs: X-ray of both feet: Dorsal first metatarsal tarsal spurring bilaterally greater left than right.  Mild HAV left, moderate HAV right Assessment:   1. Bunion   2. Acquired hallux valgus of both feet   3. Hallux interphalangeus, acquired, left   4. Pain in toe of left foot   5. Hallux limitus, left   6. Hallux limitus, right     Plan:  Patient was evaluated and treated and all questions answered.  Hallux valgus bilaterally -XR reviewed with patient -Educated on etiology of deformity -Discussed proper shoe gear modifications and padding  -Patient has failed all conservative therapy and wishes to proceed with surgical intervention. All risks, benefits, and alternatives discussed with patient. No guarantees given. Consent reviewed and signed by patient. -Planned procedures: Left foot correction of bunion with [place]/Akin osteotomy -Risk factors: none -DME dispensed: CAM walker boot for post-operative  use.  Return for surgery aftercare.

## 2020-02-02 ENCOUNTER — Encounter: Payer: Self-pay | Admitting: Podiatry

## 2020-02-16 ENCOUNTER — Other Ambulatory Visit: Payer: Self-pay | Admitting: Primary Care

## 2020-02-16 ENCOUNTER — Other Ambulatory Visit: Payer: Self-pay | Admitting: Family Medicine

## 2020-02-17 NOTE — Telephone Encounter (Signed)
She is overdue for an annual follow-up visit with me. Please schedule.

## 2020-02-17 NOTE — Telephone Encounter (Signed)
Ok to fill was given by Dr Ermalene Searing last?

## 2020-02-18 ENCOUNTER — Other Ambulatory Visit: Payer: Self-pay | Admitting: Family Medicine

## 2020-02-20 ENCOUNTER — Other Ambulatory Visit: Payer: Self-pay | Admitting: Primary Care

## 2020-02-23 NOTE — Telephone Encounter (Signed)
Refill was sent

## 2020-02-23 NOTE — Telephone Encounter (Signed)
Called patient to schedule cpe. Pt stated she will call back to schedule as she is starting new job.

## 2020-04-06 ENCOUNTER — Other Ambulatory Visit (HOSPITAL_COMMUNITY): Payer: Self-pay | Admitting: Orthopedic Surgery

## 2020-04-13 ENCOUNTER — Encounter: Payer: BC Managed Care – PPO | Admitting: Podiatry

## 2020-04-20 ENCOUNTER — Encounter: Payer: BC Managed Care – PPO | Admitting: Podiatry

## 2020-04-21 ENCOUNTER — Other Ambulatory Visit: Payer: Self-pay

## 2020-04-21 ENCOUNTER — Encounter (HOSPITAL_BASED_OUTPATIENT_CLINIC_OR_DEPARTMENT_OTHER): Payer: Self-pay | Admitting: Orthopedic Surgery

## 2020-04-27 ENCOUNTER — Other Ambulatory Visit (HOSPITAL_COMMUNITY)
Admission: RE | Admit: 2020-04-27 | Discharge: 2020-04-27 | Disposition: A | Discharge status: Home / Self Care | Payer: 59 | Source: Ambulatory Visit | Attending: Orthopedic Surgery | Admitting: Orthopedic Surgery

## 2020-04-27 DIAGNOSIS — Z20822 Contact with and (suspected) exposure to covid-19: Secondary | ICD-10-CM | POA: Diagnosis not present

## 2020-04-27 DIAGNOSIS — Z01812 Encounter for preprocedural laboratory examination: Secondary | ICD-10-CM | POA: Insufficient documentation

## 2020-04-27 LAB — SARS CORONAVIRUS 2 (TAT 6-24 HRS): SARS Coronavirus 2: NEGATIVE

## 2020-04-28 NOTE — Progress Notes (Signed)
Ensure presurgery drink given-- to finish by 0600 DOS, pt verbalized understanding       Enhanced Recovery after Surgery for Orthopedics Enhanced Recovery after Surgery is a protocol used to improve the stress on your body and your recovery after surgery.  Patient Instructions  . The night before surgery:  o No food after midnight. ONLY clear liquids after midnight  . The day of surgery (if you do NOT have diabetes):  o Drink ONE (1) Pre-Surgery Clear Ensure as directed.   o This drink was given to you during your hospital  pre-op appointment visit. o The pre-op nurse will instruct you on the time to drink the  Pre-Surgery Ensure depending on your surgery time. o Finish the drink at the designated time by the pre-op nurse.  o Nothing else to drink after completing the  Pre-Surgery Clear Ensure.  . The day of surgery (if you have diabetes): o Drink ONE (1) Gatorade 2 (G2) as directed. o This drink was given to you during your hospital  pre-op appointment visit.  o The pre-op nurse will instruct you on the time to drink the   Gatorade 2 (G2) depending on your surgery time. o Color of the Gatorade may vary. Red is not allowed. o Nothing else to drink after completing the  Gatorade 2 (G2).         If you have questions, please contact your surgeon's office.

## 2020-04-29 ENCOUNTER — Ambulatory Visit (HOSPITAL_BASED_OUTPATIENT_CLINIC_OR_DEPARTMENT_OTHER): Payer: 59 | Admitting: Certified Registered Nurse Anesthetist

## 2020-04-29 ENCOUNTER — Other Ambulatory Visit: Payer: Self-pay

## 2020-04-29 ENCOUNTER — Encounter (HOSPITAL_BASED_OUTPATIENT_CLINIC_OR_DEPARTMENT_OTHER): Payer: Self-pay | Admitting: Orthopedic Surgery

## 2020-04-29 ENCOUNTER — Ambulatory Visit (HOSPITAL_BASED_OUTPATIENT_CLINIC_OR_DEPARTMENT_OTHER)
Admission: RE | Admit: 2020-04-29 | Discharge: 2020-04-29 | Disposition: A | Discharge status: Home / Self Care | Payer: 59 | Attending: Orthopedic Surgery | Admitting: Orthopedic Surgery

## 2020-04-29 ENCOUNTER — Encounter (HOSPITAL_BASED_OUTPATIENT_CLINIC_OR_DEPARTMENT_OTHER)
Admission: RE | Disposition: A | Discharge status: Home / Self Care | Payer: Self-pay | Source: Home / Self Care | Attending: Orthopedic Surgery

## 2020-04-29 ENCOUNTER — Other Ambulatory Visit (HOSPITAL_BASED_OUTPATIENT_CLINIC_OR_DEPARTMENT_OTHER): Payer: Self-pay | Admitting: Orthopedic Surgery

## 2020-04-29 DIAGNOSIS — Z793 Long term (current) use of hormonal contraceptives: Secondary | ICD-10-CM | POA: Insufficient documentation

## 2020-04-29 DIAGNOSIS — M2022 Hallux rigidus, left foot: Secondary | ICD-10-CM | POA: Diagnosis not present

## 2020-04-29 DIAGNOSIS — Z79899 Other long term (current) drug therapy: Secondary | ICD-10-CM | POA: Insufficient documentation

## 2020-04-29 DIAGNOSIS — M21612 Bunion of left foot: Secondary | ICD-10-CM | POA: Diagnosis present

## 2020-04-29 HISTORY — DX: Gastro-esophageal reflux disease without esophagitis: K21.9

## 2020-04-29 HISTORY — PX: BUNIONECTOMY: SHX129

## 2020-04-29 LAB — POCT PREGNANCY, URINE: Preg Test, Ur: NEGATIVE

## 2020-04-29 SURGERY — BUNIONECTOMY
Anesthesia: General | Site: Foot | Laterality: Left

## 2020-04-29 MED ORDER — PROMETHAZINE HCL 25 MG/ML IJ SOLN: 6.2500 mg | INTRAMUSCULAR | Status: DC | PRN

## 2020-04-29 MED ORDER — VANCOMYCIN HCL 500 MG IV SOLR
INTRAVENOUS | Status: DC | PRN
  Administered 2020-04-29: 500 mg via TOPICAL

## 2020-04-29 MED ORDER — ONDANSETRON HCL 4 MG/2ML IJ SOLN
INTRAMUSCULAR | Status: DC | PRN
  Administered 2020-04-29: 4 mg via INTRAVENOUS

## 2020-04-29 MED ORDER — MIDAZOLAM HCL 2 MG/2ML IJ SOLN
2.0000 mg | Freq: Once | INTRAMUSCULAR | Status: AC
  Administered 2020-04-29: 2 mg via INTRAVENOUS

## 2020-04-29 MED ORDER — LACTATED RINGERS IV SOLN: INTRAVENOUS | Status: DC

## 2020-04-29 MED ORDER — CEFAZOLIN SODIUM-DEXTROSE 2-4 GM/100ML-% IV SOLN
INTRAVENOUS | Status: AC
  Filled 2020-04-29: qty 100

## 2020-04-29 MED ORDER — MIDAZOLAM HCL 2 MG/2ML IJ SOLN
INTRAMUSCULAR | Status: AC
  Filled 2020-04-29: qty 2

## 2020-04-29 MED ORDER — PROPOFOL 500 MG/50ML IV EMUL
INTRAVENOUS | Status: DC | PRN
  Administered 2020-04-29: 25 ug/kg/min via INTRAVENOUS

## 2020-04-29 MED ORDER — FENTANYL CITRATE (PF) 100 MCG/2ML IJ SOLN
100.0000 ug | Freq: Once | INTRAMUSCULAR | Status: AC
Start: 2020-04-29 — End: 2020-04-29
  Administered 2020-04-29: 100 ug via INTRAVENOUS

## 2020-04-29 MED ORDER — 0.9 % SODIUM CHLORIDE (POUR BTL) OPTIME
TOPICAL | Status: DC | PRN
  Administered 2020-04-29: 200 mL

## 2020-04-29 MED ORDER — OXYCODONE HCL 5 MG/5ML PO SOLN: 5.0000 mg | Freq: Once | ORAL | Status: DC | PRN

## 2020-04-29 MED ORDER — OXYCODONE HCL 5 MG PO TABS
5.0000 mg | ORAL_TABLET | Freq: Once | ORAL | Status: DC | PRN
Start: 2020-04-29 — End: 2020-04-29

## 2020-04-29 MED ORDER — OXYCODONE HCL 5 MG PO TABS: 5.0000 mg | ORAL_TABLET | Freq: Four times a day (QID) | ORAL | 0 refills | Status: DC | PRN

## 2020-04-29 MED ORDER — FENTANYL CITRATE (PF) 100 MCG/2ML IJ SOLN
INTRAMUSCULAR | Status: AC
  Filled 2020-04-29: qty 2

## 2020-04-29 MED ORDER — AMISULPRIDE (ANTIEMETIC) 5 MG/2ML IV SOLN: 10.0000 mg | Freq: Once | INTRAVENOUS | Status: DC | PRN

## 2020-04-29 MED ORDER — MEPERIDINE HCL 25 MG/ML IJ SOLN
6.2500 mg | INTRAMUSCULAR | Status: DC | PRN
Start: 2020-04-29 — End: 2020-04-29

## 2020-04-29 MED ORDER — LIDOCAINE HCL (CARDIAC) PF 100 MG/5ML IV SOSY
PREFILLED_SYRINGE | INTRAVENOUS | Status: DC | PRN
  Administered 2020-04-29: 60 mg via INTRAVENOUS

## 2020-04-29 MED ORDER — HYDROMORPHONE HCL 1 MG/ML IJ SOLN
0.2500 mg | INTRAMUSCULAR | Status: DC | PRN
Start: 2020-04-29 — End: 2020-04-29

## 2020-04-29 MED ORDER — DEXAMETHASONE SODIUM PHOSPHATE 10 MG/ML IJ SOLN
INTRAMUSCULAR | Status: DC | PRN
  Administered 2020-04-29: 4 mg via INTRAVENOUS

## 2020-04-29 MED ORDER — SODIUM CHLORIDE 0.9 % IV SOLN
INTRAVENOUS | Status: DC
Start: 2020-04-29 — End: 2020-04-29

## 2020-04-29 MED ORDER — ROPIVACAINE HCL 5 MG/ML IJ SOLN
INTRAMUSCULAR | Status: DC | PRN
  Administered 2020-04-29: 30 mL via PERINEURAL

## 2020-04-29 MED ORDER — CEFAZOLIN SODIUM-DEXTROSE 2-4 GM/100ML-% IV SOLN
2.0000 g | INTRAVENOUS | Status: AC
  Administered 2020-04-29: 2 g via INTRAVENOUS

## 2020-04-29 MED ORDER — PROPOFOL 10 MG/ML IV BOLUS
INTRAVENOUS | Status: DC | PRN
  Administered 2020-04-29: 200 mg via INTRAVENOUS

## 2020-04-29 SURGICAL SUPPLY — 78 items
APL PRP STRL LF DISP 70% ISPRP (MISCELLANEOUS) ×1
BANDAGE ESMARK 6X9 LF (GAUZE/BANDAGES/DRESSINGS) IMPLANT
BIT DRILL 1.5X85 (BIT) ×2 IMPLANT
BLADE AVERAGE 25X9 (BLADE) ×4 IMPLANT
BLADE LONG MED 25X9 (BLADE) IMPLANT
BLADE MICRO SAGITTAL (BLADE) IMPLANT
BLADE MINI RND TIP GREEN BEAV (BLADE) IMPLANT
BLADE OSC/SAG .038X5.5 CUT EDG (BLADE) IMPLANT
BLADE SURG 15 STRL LF DISP TIS (BLADE) ×4 IMPLANT
BLADE SURG 15 STRL SS (BLADE) ×8
BNDG CMPR 9X6 STRL LF SNTH (GAUZE/BANDAGES/DRESSINGS)
BNDG COHESIVE 4X5 TAN STRL (GAUZE/BANDAGES/DRESSINGS) ×2 IMPLANT
BNDG COHESIVE 6X5 TAN STRL LF (GAUZE/BANDAGES/DRESSINGS) IMPLANT
BNDG CONFORM 2 STRL LF (GAUZE/BANDAGES/DRESSINGS) IMPLANT
BNDG CONFORM 3 STRL LF (GAUZE/BANDAGES/DRESSINGS) ×2 IMPLANT
BNDG ESMARK 6X9 LF (GAUZE/BANDAGES/DRESSINGS)
CHLORAPREP W/TINT 26 (MISCELLANEOUS) ×2 IMPLANT
COVER BACK TABLE 60X90IN (DRAPES) ×2 IMPLANT
COVER WAND RF STERILE (DRAPES) IMPLANT
CUFF TOURN SGL QUICK 24 (TOURNIQUET CUFF)
CUFF TOURN SGL QUICK 34 (TOURNIQUET CUFF) ×2
CUFF TRNQT CYL 24X4X16.5-23 (TOURNIQUET CUFF) IMPLANT
CUFF TRNQT CYL 34X4.125X (TOURNIQUET CUFF) ×1 IMPLANT
DRAPE EXTREMITY T 121X128X90 (DISPOSABLE) ×2 IMPLANT
DRAPE OEC MINIVIEW 54X84 (DRAPES) ×2 IMPLANT
DRAPE U-SHAPE 47X51 STRL (DRAPES) ×2 IMPLANT
DRSG MEPITEL 4X7.2 (GAUZE/BANDAGES/DRESSINGS) ×2 IMPLANT
DRSG PAD ABDOMINAL 8X10 ST (GAUZE/BANDAGES/DRESSINGS) ×2 IMPLANT
ELECT REM PT RETURN 9FT ADLT (ELECTROSURGICAL) ×2
ELECTRODE REM PT RTRN 9FT ADLT (ELECTROSURGICAL) ×1 IMPLANT
GAUZE SPONGE 4X4 12PLY STRL (GAUZE/BANDAGES/DRESSINGS) ×2 IMPLANT
GLOVE BIO SURGEON STRL SZ8 (GLOVE) ×2 IMPLANT
GLOVE ECLIPSE 8.0 STRL XLNG CF (GLOVE) ×2 IMPLANT
GLOVE SRG 8 PF TXTR STRL LF DI (GLOVE) ×2 IMPLANT
GLOVE SURG SS PI 7.0 STRL IVOR (GLOVE) ×2 IMPLANT
GLOVE SURG SS PI 7.5 STRL IVOR (GLOVE) ×2 IMPLANT
GLOVE SURG UNDER POLY LF SZ7 (GLOVE) ×4 IMPLANT
GLOVE SURG UNDER POLY LF SZ8 (GLOVE) ×4
GOWN STRL REUS W/ TWL LRG LVL3 (GOWN DISPOSABLE) ×1 IMPLANT
GOWN STRL REUS W/ TWL XL LVL3 (GOWN DISPOSABLE) ×2 IMPLANT
GOWN STRL REUS W/TWL 2XL LVL3 (GOWN DISPOSABLE) ×2 IMPLANT
GOWN STRL REUS W/TWL LRG LVL3 (GOWN DISPOSABLE) ×2
GOWN STRL REUS W/TWL XL LVL3 (GOWN DISPOSABLE) ×4
K-WIRE DBL END .054 LG (WIRE) ×2 IMPLANT
NEEDLE HYPO 22GX1.5 SAFETY (NEEDLE) IMPLANT
NEEDLE HYPO 25X1 1.5 SAFETY (NEEDLE) IMPLANT
NS IRRIG 1000ML POUR BTL (IV SOLUTION) ×2 IMPLANT
PACK BASIN DAY SURGERY FS (CUSTOM PROCEDURE TRAY) ×2 IMPLANT
PAD CAST 4YDX4 CTTN HI CHSV (CAST SUPPLIES) ×1 IMPLANT
PADDING CAST ABS 4INX4YD NS (CAST SUPPLIES)
PADDING CAST ABS COTTON 4X4 ST (CAST SUPPLIES) IMPLANT
PADDING CAST COTTON 4X4 STRL (CAST SUPPLIES) ×2
PADDING CAST COTTON 6X4 STRL (CAST SUPPLIES) IMPLANT
PENCIL SMOKE EVACUATOR (MISCELLANEOUS) ×2 IMPLANT
SANITIZER HAND PURELL 535ML FO (MISCELLANEOUS) ×2 IMPLANT
SCREW CORT ST MINI HEX 2X18 (Screw) ×2 IMPLANT
SCREW CORT ST MINI HEX 2X22 (Screw) ×2 IMPLANT
SHEET MEDIUM DRAPE 40X70 STRL (DRAPES) ×2 IMPLANT
SLEEVE SCD COMPRESS KNEE MED (MISCELLANEOUS) ×2 IMPLANT
SPLINT FAST PLASTER 5X30 (CAST SUPPLIES)
SPLINT PLASTER CAST FAST 5X30 (CAST SUPPLIES) IMPLANT
SPONGE LAP 18X18 RF (DISPOSABLE) ×2 IMPLANT
STOCKINETTE 6  STRL (DRAPES) ×2
STOCKINETTE 6 STRL (DRAPES) ×1 IMPLANT
SUCTION FRAZIER HANDLE 10FR (MISCELLANEOUS) ×2
SUCTION TUBE FRAZIER 10FR DISP (MISCELLANEOUS) ×1 IMPLANT
SUT ETHILON 3 0 PS 1 (SUTURE) ×2 IMPLANT
SUT MNCRL AB 3-0 PS2 18 (SUTURE) ×2 IMPLANT
SUT VIC AB 0 SH 27 (SUTURE) IMPLANT
SUT VIC AB 2-0 SH 27 (SUTURE)
SUT VIC AB 2-0 SH 27XBRD (SUTURE) IMPLANT
SUT VICRYL 0 UR6 27IN ABS (SUTURE) ×2 IMPLANT
SYR BULB EAR ULCER 3OZ GRN STR (SYRINGE) ×2 IMPLANT
SYR CONTROL 10ML LL (SYRINGE) IMPLANT
TOWEL GREEN STERILE FF (TOWEL DISPOSABLE) ×4 IMPLANT
TUBE CONNECTING 20X1/4 (TUBING) ×2 IMPLANT
UNDERPAD 30X36 HEAVY ABSORB (UNDERPADS AND DIAPERS) ×2 IMPLANT
YANKAUER SUCT BULB TIP NO VENT (SUCTIONS) IMPLANT

## 2020-04-29 NOTE — Discharge Instructions (Addendum)
John Hewitt, MD [facility]  Please read the following information regarding your care after surgery.  Medications  You only need a prescription for the narcotic pain medicine (ex. oxycodone, Percocet, Norco).  All of the other medicines listed below are available over the counter. X Aleve 2 pills twice a day for the first 3 days after surgery. X acetominophen (Tylenol) 650 mg every 4-6 hours as you need for minor to moderate pain X oxycodone as prescribed for severe pain  Narcotic pain medicine (ex. oxycodone, Percocet, Vicodin) will cause constipation.  To prevent this problem, take the following medicines while you are taking any pain medicine. X docusate sodium (Colace) 100 mg twice a day X senna (Senokot) 2 tablets twice a day  X To help prevent blood clots, take a baby aspirin (81 mg) twice a day for two weeks after surgery.  You should also get up every hour while you are awake to move around.    Weight Bearing ? Bear weight when you are able on your operated leg or foot. ? Bear weight only on your operated foot in the post-op shoe. X Do not bear any weight on the operated leg or foot.  Cast / Splint / Dressing X Keep your splint, cast or dressing clean and dry.  Don't put anything (coat hanger, pencil, etc) down inside of it.  If it gets damp, use a hair dryer on the cool setting to dry it.  If it gets soaked, call the office to schedule an appointment for a cast change. ? Remove your dressing 3 days after surgery and cover the incisions with dry dressings.    After your dressing, cast or splint is removed; you may shower, but do not soak or scrub the wound.  Allow the water to run over it, and then gently pat it dry.  Swelling It is normal for you to have swelling where you had surgery.  To reduce swelling and pain, keep your toes above your nose for at least 3 days after surgery.  It may be necessary to keep your foot or leg elevated for several weeks.  If it hurts, it should be  elevated.  Follow Up Call my office at [phone] when you are discharged from the hospital or surgery center to schedule an appointment to be seen two weeks after surgery.  Call my office at [phone] if you develop a fever >101.5 F, nausea, vomiting, bleeding from the surgical site or severe pain.     Post Anesthesia Home Care Instructions  Activity: Get plenty of rest for the remainder of the day. A responsible individual must stay with you for 24 hours following the procedure.  For the next 24 hours, DO NOT: -Drive a car -Operate machinery -Drink alcoholic beverages -Take any medication unless instructed by your physician -Make any legal decisions or sign important papers.  Meals: Start with liquid foods such as gelatin or soup. Progress to regular foods as tolerated. Avoid greasy, spicy, heavy foods. If nausea and/or vomiting occur, drink only clear liquids until the nausea and/or vomiting subsides. Call your physician if vomiting continues.  Special Instructions/Symptoms: Your throat may feel dry or sore from the anesthesia or the breathing tube placed in your throat during surgery. If this causes discomfort, gargle with warm salt water. The discomfort should disappear within 24 hours.  If you had a scopolamine patch placed behind your ear for the management of post- operative nausea and/or vomiting:  1. The medication in the patch is   effective for 72 hours, after which it should be removed.  Wrap patch in a tissue and discard in the trash. Wash hands thoroughly with soap and water. 2. You may remove the patch earlier than 72 hours if you experience unpleasant side effects which may include dry mouth, dizziness or visual disturbances. 3. Avoid touching the patch. Wash your hands with soap and water after contact with the patch.    Regional Anesthesia Blocks  1. Numbness or the inability to move the "blocked" extremity may last from 3-48 hours after placement. The length  of time depends on the medication injected and your individual response to the medication. If the numbness is not going away after 48 hours, call your surgeon.  2. The extremity that is blocked will need to be protected until the numbness is gone and the  Strength has returned. Because you cannot feel it, you will need to take extra care to avoid injury. Because it may be weak, you may have difficulty moving it or using it. You may not know what position it is in without looking at it while the block is in effect.  3. For blocks in the legs and feet, returning to weight bearing and walking needs to be done carefully. You will need to wait until the numbness is entirely gone and the strength has returned. You should be able to move your leg and foot normally before you try and bear weight or walk. You will need someone to be with you when you first try to ensure you do not fall and possibly risk injury.  4. Bruising and tenderness at the needle site are common side effects and will resolve in a few days.  5. Persistent numbness or new problems with movement should be communicated to the surgeon or the [facility] ([phone])/ [facility] ([phone]). 

## 2020-04-29 NOTE — Transfer of Care (Signed)
Immediate Anesthesia Transfer of Care Note  Patient: Laurie Burns  Procedure(s) Performed: Left First metatarsal scarf osteotomy, akin osteotomy, modified McBride and cheilectomy (Left Foot)  Patient Location: PACU  Anesthesia Type:GA combined with regional for post-op pain  Level of Consciousness: drowsy and patient cooperative  Airway & Oxygen Therapy: Patient Spontanous Breathing and Patient connected to face mask oxygen  Post-op Assessment: Report given to RN and Post -op Vital signs reviewed and stable  Post vital signs: Reviewed and stable  Last Vitals:  Vitals Value Taken Time  BP    Temp    Pulse    Resp    SpO2      Last Pain:  Vitals:   04/29/20 0924  TempSrc: Oral  PainSc: 0-No pain      Patients Stated Pain Goal: 7 (04/29/20 0924)  Complications: No complications documented.

## 2020-04-29 NOTE — Anesthesia Procedure Notes (Signed)
Procedure Name: LMA Insertion Date/Time: 04/29/2020 12:08 PM Performed by: Sheryn Bison, CRNA Pre-anesthesia Checklist: Patient identified, Emergency Drugs available, Suction available and Patient being monitored Patient Re-evaluated:Patient Re-evaluated prior to induction Oxygen Delivery Method: Circle System Utilized Preoxygenation: Pre-oxygenation with 100% oxygen Induction Type: IV induction Ventilation: Mask ventilation without difficulty LMA: LMA inserted LMA Size: 4.0 Number of attempts: 1 Airway Equipment and Method: bite block Placement Confirmation: positive ETCO2 Tube secured with: Tape Dental Injury: Teeth and Oropharynx as per pre-operative assessment

## 2020-04-29 NOTE — Anesthesia Procedure Notes (Signed)
Anesthesia Regional Block: Popliteal block   Pre-Anesthetic Checklist: ,, timeout performed, Correct Patient, Correct Site, Correct Laterality, Correct Procedure, Correct Position, site marked, Risks and benefits discussed,  Surgical consent,  Pre-op evaluation,  At surgeon's request and post-op pain management  Laterality: Left  Prep: chloraprep       Needles:  Injection technique: Single-shot  Needle Type: Stimiplex     Needle Length: 9cm  Needle Gauge: 21     Additional Needles:   Procedures:,,,, ultrasound used (permanent image in chart),,,,  Narrative:  Start time: 04/29/2020 11:29 AM End time: 04/29/2020 11:34 AM Injection made incrementally with aspirations every 5 mL.  Performed by: Personally  Anesthesiologist: Lowella Curb, MD

## 2020-04-29 NOTE — Anesthesia Preprocedure Evaluation (Signed)
Anesthesia Evaluation  Patient identified by MRN, date of birth, ID band Patient awake    Reviewed: Allergy & Precautions, H&P , NPO status , Patient's Chart, lab work & pertinent test results  Airway Mallampati: II  TM Distance: >3 FB Neck ROM: Full    Dental no notable dental hx.    Pulmonary neg pulmonary ROS,    Pulmonary exam normal breath sounds clear to auscultation       Cardiovascular negative cardio ROS Normal cardiovascular exam Rhythm:Regular Rate:Normal     Neuro/Psych  Headaches, Anxiety Depression negative psych ROS   GI/Hepatic Neg liver ROS, GERD  ,  Endo/Other  negative endocrine ROS  Renal/GU negative Renal ROS  negative genitourinary   Musculoskeletal negative musculoskeletal ROS (+)   Abdominal (+) + obese,   Peds negative pediatric ROS (+)  Hematology negative hematology ROS (+)   Anesthesia Other Findings   Reproductive/Obstetrics negative OB ROS                             Anesthesia Physical Anesthesia Plan  ASA: II  Anesthesia Plan: General   Post-op Pain Management:  Regional for Post-op pain   Induction: Intravenous  PONV Risk Score and Plan: 3 and Ondansetron, Dexamethasone, Midazolam and Treatment may vary due to age or medical condition  Airway Management Planned: LMA  Additional Equipment:   Intra-op Plan:   Post-operative Plan: Extubation in OR  Informed Consent: I have reviewed the patients History and Physical, chart, labs and discussed the procedure including the risks, benefits and alternatives for the proposed anesthesia with the patient or authorized representative who has indicated his/her understanding and acceptance.     Dental advisory given  Plan Discussed with: CRNA  Anesthesia Plan Comments:         Anesthesia Quick Evaluation

## 2020-04-29 NOTE — Progress Notes (Signed)
Assisted Dr. Miller with left, ultrasound guided, popliteal block. Side rails up, monitors on throughout procedure. See vital signs in flow sheet. Tolerated Procedure well. 

## 2020-04-29 NOTE — Anesthesia Postprocedure Evaluation (Signed)
Anesthesia Post Note  Patient: Laurie Burns  Procedure(s) Performed: Left First metatarsal scarf osteotomy, akin osteotomy, modified McBride and cheilectomy (Left Foot)     Patient location during evaluation: PACU Anesthesia Type: General Level of consciousness: awake and alert Pain management: pain level controlled Vital Signs Assessment: post-procedure vital signs reviewed and stable Respiratory status: spontaneous breathing, nonlabored ventilation and respiratory function stable Cardiovascular status: blood pressure returned to baseline and stable Postop Assessment: no apparent nausea or vomiting Anesthetic complications: no   No complications documented.  Last Vitals:  Vitals:   04/29/20 1330 04/29/20 1352  BP: 113/61 117/77  Pulse: 79   Resp: 19 18  Temp:  36.9 C  SpO2: 99% 97%    Last Pain:  Vitals:   04/29/20 1352  TempSrc:   PainSc: 0-No pain                 Lowella Curb

## 2020-04-29 NOTE — H&P (Signed)
Laurie Burns is an 25 y.o. female.   Chief Complaint: Left foot pain HPI: Patient is a 25 year old female without significant past medical history.  She has a painful left foot bunion deformity with worsening symptoms over the last year.  She has failed nonoperative treatment to date with activity modification, oral anti-inflammatories and shoewear modification.  She presents now for operative treatment of this painful left forefoot bunion.  Past Medical History:  Diagnosis Date  . GERD (gastroesophageal reflux disease)     History reviewed. No pertinent surgical history.  History reviewed. No pertinent family history. Social History:  reports that she has never smoked. She has never used smokeless tobacco. She reports that she does not drink alcohol and does not use drugs.  Allergies: No Known Allergies  Medications Prior to Admission  Medication Sig Dispense Refill  . FLUoxetine (PROZAC) 20 MG capsule TAKE 1 CAPSULE BY MOUTH EVERY DAY FOR ANXIETY AND DEPRESSION 30 capsule 5  . omeprazole (PRILOSEC) 20 MG capsule Take 1 capsule (20 mg total) by mouth daily. For acid reflux. 90 capsule 1  . rOPINIRole (REQUIP) 0.25 MG tablet TAKE 1 TABLET BY MOUTH AT BEDTIME. 30 tablet 1  . fluticasone (FLONASE) 50 MCG/ACT nasal spray Place 2 sprays into both nostrils daily. 16 g 6  . levonorgestrel (MIRENA, 52 MG,) 20 MCG/24HR IUD Mirena 20 mcg/24 hours (6 yrs) 52 mg intrauterine device  Take 1 device by intrauterine route.      Results for orders placed or performed during the hospital encounter of 2020-05-15 (from the past 48 hour(s))  Pregnancy, urine POC     Status: None   Collection Time: 05/15/2020  9:15 AM  Result Value Ref Range   Preg Test, Ur NEGATIVE NEGATIVE    Comment:        THE SENSITIVITY OF THIS METHODOLOGY IS >24 mIU/mL    No results found.  Review of Systems no recent fever, chills, nausea, vomiting or changes in her appetite  Blood pressure 117/66, pulse 71, temperature  98.3 F (36.8 C), temperature source Oral, resp. rate 15, height 5\' 9"  (1.753 m), weight 92.8 kg, SpO2 100 %. Physical Exam  Well-nourished well-developed woman in no apparent distress.  Alert and oriented x4.  Normal mood and affect.  Gait is normal.  Left foot has a moderate bunion deformity.  Skin is healthy and intact.  Pulses are palpable.  No lymphadenopathy.  Intact sensibility to light touch dorsally and plantarly at the forefoot.   Assessment/Plan Painful left foot bunion deformity -to the operating room today for modified McBride bunionectomy, first metatarsal scarf osteotomy and Akin osteotomy of the proximal phalanx.  The risks and benefits of the alternative treatment options have been discussed in detail.  The patient wishes to proceed with surgery and specifically understands risks of bleeding, infection, nerve damage, blood clots, need for additional surgery, amputation and death.   , MD 2020-05-15, 11:39 AM

## 2020-04-29 NOTE — Op Note (Signed)
04/29/2020  1:14 PM  PATIENT:  Laurie Burns  25 y.o. female  PRE-OPERATIVE DIAGNOSIS: Painful left foot bunion deformity  POST-OPERATIVE DIAGNOSIS: 1.  Painful left foot bunion deformity      2.  Left hallux rigidus  Procedure(s): 1.  Left First metatarsal chevron osteotomy 2.  Left hallux proximal phalanx akin osteotomy 3.  Left modified McBride bunionectomy and metatarsal head cheilectomy 4.  AP and lateral radiographs of the left foot  SURGEON:  Toni Arthurs, MD  ASSISTANT: Alfredo Martinez, PA-C  ANESTHESIA:   General, regional  EBL:  minimal   TOURNIQUET: Less than 1 hour at 250 mmHg thigh  COMPLICATIONS:  None apparent  DISPOSITION:  Extubated, awake and stable to recovery.  INDICATION FOR PROCEDURE: The patient is a 25 year old female with left forefoot pain at the hallux MP joint.  She has a small bunion deformity as well as dorsal bossing at the first metatarsal head.  She has failed nonoperative treatment to date including activity modification, oral anti-inflammatories and shoewear modification.  She presents now for surgical correction of the bunion and cheilectomy.  She understands the risks and benefits of the alternative treatment options and elect surgical treatment.  She specifically understands risks of bleeding, infection, nerve damage, blood clots, recurrence of the bunion deformity, need for additional surgery, progression of her arthritis, amputation and death.  PROCEDURE IN DETAIL: After preoperative consent was obtained and the correct operative site was identified, the patient was brought the operating room and placed upon the operating table.  General anesthesia was induced.  IV antibiotics were administered.  A surgical timeout was taken.  The left lower extremity was prepped and draped in standard sterile fashion with a tourniquet around the thigh.  The extremity was elevated and the tourniquet was inflated to 250 mmHg.  A longitudinal incision was made over  the medial eminence.  Dissection was carried down to the subcutaneous tissues.  The hypertrophic medial eminence was exposed after elevating the joint capsule dorsally and plantarly.  The medial eminence was resected in line with the metatarsal shaft.  The metatarsal head was exposed.  There is a large dorsal osteophyte evident.  This was resected with the oscillating saw.  The remaining articular cartilage appeared generally healthy.  A chevron osteotomy was then cut in the metatarsal neck.  The head of the metatarsal was translated laterally and fixed with a 2. mm [employer] stainless steel screw.  Radiographs confirmed appropriate correction of the intermetatarsal angle.  The toe was still noted to have a bit of residual hallux valgus interphalangeus.  Dissection was carried distally along the base of the proximal phalanx.  Subperiosteal dissection was carried dorsally and plantarly.  Flexor and extensor tendons were protected.  A medial closing wedge osteotomy was made at the base of the proximal phalanx.  The osteotomy was closed and fixed with a 2.0 mm fully threaded cortical screw.  Final AP and lateral radiographs confirmed appropriate correction of the bunion deformity and appropriate position and length of all hardware.  Wound was irrigated copiously.  Medial joint capsule was repaired with imbricating sutures of 2-0 Vicryl.  Deep subcutaneous tissues were approximated with Vicryl.  Skin incision was closed with 3-0 nylon.  Sterile dressings were applied followed by a bunion wrap.  The tourniquet was released after application of the dressings.  The patient was awakened from anesthesia and transported to the recovery room in stable condition.   FOLLOW UP PLAN: Weightbearing as tolerated on the heel  in a Darco shoe.  Follow-up in the office in 2 weeks for suture removal and a toe spacer.  No indication for DVT prophylaxis in this ambulatory patient.   RADIOGRAPHS: AP and lateral radiographs of the  left foot are obtained intraoperatively.  These show interval correction of the bunion deformity and resection of dorsal osteophytes from the first metatarsal head.  Osteotomies of the first metatarsal and hallux proximal phalanx were appropriately aligned.  No other acute injuries are noted.    Alfredo Martinez PA-C was present and scrubbed for the duration of the operative case. His assistance was essential in positioning the patient, prepping and draping, gaining and maintaining exposure, performing the operation, closing and dressing the wounds and applying the splint.

## 2020-04-30 ENCOUNTER — Encounter (HOSPITAL_BASED_OUTPATIENT_CLINIC_OR_DEPARTMENT_OTHER): Payer: Self-pay | Admitting: Orthopedic Surgery

## 2020-05-03 ENCOUNTER — Other Ambulatory Visit (HOSPITAL_COMMUNITY): Payer: Self-pay | Admitting: Student

## 2020-05-04 ENCOUNTER — Encounter: Payer: BC Managed Care – PPO | Admitting: Podiatry

## 2020-05-11 ENCOUNTER — Encounter: Payer: BC Managed Care – PPO | Admitting: Podiatry

## 2020-05-11 ENCOUNTER — Other Ambulatory Visit (HOSPITAL_COMMUNITY): Payer: Self-pay | Admitting: Student

## 2020-05-17 ENCOUNTER — Other Ambulatory Visit (HOSPITAL_COMMUNITY): Payer: Self-pay | Admitting: Student

## 2020-05-18 ENCOUNTER — Encounter: Payer: BC Managed Care – PPO | Admitting: Podiatry

## 2020-06-01 ENCOUNTER — Encounter: Payer: BC Managed Care – PPO | Admitting: Podiatry

## 2020-06-02 ENCOUNTER — Other Ambulatory Visit (HOSPITAL_BASED_OUTPATIENT_CLINIC_OR_DEPARTMENT_OTHER): Payer: Self-pay

## 2020-06-03 ENCOUNTER — Other Ambulatory Visit (HOSPITAL_BASED_OUTPATIENT_CLINIC_OR_DEPARTMENT_OTHER): Payer: Self-pay

## 2020-06-04 ENCOUNTER — Other Ambulatory Visit (HOSPITAL_BASED_OUTPATIENT_CLINIC_OR_DEPARTMENT_OTHER): Payer: Self-pay

## 2020-06-07 ENCOUNTER — Other Ambulatory Visit (HOSPITAL_BASED_OUTPATIENT_CLINIC_OR_DEPARTMENT_OTHER): Payer: Self-pay

## 2020-06-17 ENCOUNTER — Other Ambulatory Visit (HOSPITAL_BASED_OUTPATIENT_CLINIC_OR_DEPARTMENT_OTHER): Payer: Self-pay

## 2020-06-28 ENCOUNTER — Other Ambulatory Visit: Payer: Self-pay

## 2020-06-28 ENCOUNTER — Other Ambulatory Visit (HOSPITAL_BASED_OUTPATIENT_CLINIC_OR_DEPARTMENT_OTHER): Payer: Self-pay

## 2020-06-28 MED ORDER — FLUOXETINE HCL 20 MG PO CAPS: ORAL_CAPSULE | ORAL | 0 refills | Status: DC

## 2020-06-30 ENCOUNTER — Other Ambulatory Visit: Payer: Self-pay

## 2020-06-30 ENCOUNTER — Other Ambulatory Visit (HOSPITAL_COMMUNITY): Payer: Self-pay

## 2020-06-30 ENCOUNTER — Other Ambulatory Visit (HOSPITAL_BASED_OUTPATIENT_CLINIC_OR_DEPARTMENT_OTHER): Payer: Self-pay

## 2020-07-01 ENCOUNTER — Other Ambulatory Visit (HOSPITAL_COMMUNITY): Payer: Self-pay

## 2020-08-06 ENCOUNTER — Encounter: Payer: 59 | Admitting: Primary Care

## 2020-08-09 ENCOUNTER — Other Ambulatory Visit: Payer: Self-pay | Admitting: Primary Care

## 2020-08-09 ENCOUNTER — Other Ambulatory Visit (HOSPITAL_COMMUNITY): Payer: Self-pay

## 2020-08-09 DIAGNOSIS — K219 Gastro-esophageal reflux disease without esophagitis: Secondary | ICD-10-CM

## 2020-08-09 DIAGNOSIS — F419 Anxiety disorder, unspecified: Secondary | ICD-10-CM

## 2020-08-09 DIAGNOSIS — F32A Depression, unspecified: Secondary | ICD-10-CM

## 2020-08-09 MED ORDER — OMEPRAZOLE 20 MG PO CPDR
DELAYED_RELEASE_CAPSULE | ORAL | 0 refills | Status: DC
  Filled 2020-08-09: qty 30, 30d supply, fill #0

## 2020-08-09 MED ORDER — FLUOXETINE HCL 20 MG PO CAPS
ORAL_CAPSULE | ORAL | 0 refills | Status: DC
  Filled 2020-08-09: qty 30, 30d supply, fill #0

## 2020-08-10 ENCOUNTER — Other Ambulatory Visit (HOSPITAL_COMMUNITY): Payer: Self-pay

## 2020-08-11 ENCOUNTER — Encounter: Payer: 59 | Admitting: Primary Care

## 2020-08-27 ENCOUNTER — Ambulatory Visit: Payer: 59 | Admitting: Primary Care

## 2020-09-07 ENCOUNTER — Ambulatory Visit: Payer: 59 | Admitting: Primary Care

## 2021-01-20 ENCOUNTER — Encounter: Payer: 59 | Admitting: Primary Care

## 2021-01-25 ENCOUNTER — Other Ambulatory Visit: Payer: Self-pay

## 2021-01-25 ENCOUNTER — Ambulatory Visit (INDEPENDENT_AMBULATORY_CARE_PROVIDER_SITE_OTHER): Payer: 59 | Admitting: Primary Care

## 2021-01-25 ENCOUNTER — Encounter: Payer: Self-pay | Admitting: Primary Care

## 2021-01-25 VITALS — BP 110/74 | HR 86 | Temp 98.3°F | Ht 69.0 in | Wt 218.0 lb

## 2021-01-25 DIAGNOSIS — Z23 Encounter for immunization: Secondary | ICD-10-CM | POA: Diagnosis not present

## 2021-01-25 DIAGNOSIS — G43709 Chronic migraine without aura, not intractable, without status migrainosus: Secondary | ICD-10-CM | POA: Diagnosis not present

## 2021-01-25 DIAGNOSIS — G2581 Restless legs syndrome: Secondary | ICD-10-CM

## 2021-01-25 DIAGNOSIS — F419 Anxiety disorder, unspecified: Secondary | ICD-10-CM

## 2021-01-25 DIAGNOSIS — Z0000 Encounter for general adult medical examination without abnormal findings: Secondary | ICD-10-CM | POA: Diagnosis not present

## 2021-01-25 DIAGNOSIS — K219 Gastro-esophageal reflux disease without esophagitis: Secondary | ICD-10-CM

## 2021-01-25 DIAGNOSIS — F32A Depression, unspecified: Secondary | ICD-10-CM

## 2021-01-25 MED ORDER — FLUOXETINE HCL 20 MG PO CAPS: ORAL_CAPSULE | ORAL | 3 refills | Status: DC

## 2021-01-25 MED ORDER — OMEPRAZOLE 20 MG PO CPDR: 20.0000 mg | DELAYED_RELEASE_CAPSULE | Freq: Every day | ORAL | 0 refills | Status: DC | PRN

## 2021-01-25 MED ORDER — ROPINIROLE HCL 0.25 MG PO TABS: 0.2500 mg | ORAL_TABLET | Freq: Every day | ORAL | 0 refills | Status: DC

## 2021-01-25 NOTE — Addendum Note (Signed)
Addended by: Donnamarie Poag on: 01/25/2021 03:55 PM   Modules accepted: Orders

## 2021-01-25 NOTE — Progress Notes (Signed)
Subjective:    Patient ID: Laurie Burns, female    DOB: 08/02/1995, 25 y.o.   MRN: [id number]  HPI  Laurie Burns is a very pleasant 25 y.o. female who presents today for complete physical and follow up of chronic conditions.  Immunizations: -Tetanus: Due -Influenza: Due today -Covid-19: 2 vaccines  Diet: Fair diet.  Exercise: No regular exercise.  Eye exam: Completes annually  Dental exam: Completes semi-annually   Pap Smear: UTD, follows with GYN.  BP Readings from Last 3 Encounters:  01/25/21 110/74  04/29/20 117/77  01/06/20 110/79         Review of Systems  Constitutional:  Negative for unexpected weight change.  HENT:  Negative for rhinorrhea.   Eyes:  Negative for visual disturbance.  Respiratory:  Negative for shortness of breath.   Cardiovascular:  Negative for chest pain.  Gastrointestinal:  Negative for constipation and diarrhea.       Return in GERD symptoms.   Genitourinary:  Negative for difficulty urinating.  Musculoskeletal:  Negative for arthralgias and myalgias.  Skin:  Negative for rash.  Allergic/Immunologic: Negative for environmental allergies.  Neurological:  Negative for dizziness, numbness and headaches.  Psychiatric/Behavioral:  The patient is nervous/anxious.         Past Medical History:  Diagnosis Date   GERD (gastroesophageal reflux disease)     Social History   Socioeconomic History   Marital status: Married    Spouse name: Not on file   Number of children: Not on file   Years of education: Not on file   Highest education level: Not on file  Occupational History   Not on file  Tobacco Use   Smoking status: Never   Smokeless tobacco: Never  Vaping Use   Vaping Use: Every day   Substances: Nicotine  Substance and Sexual Activity   Alcohol use: No   Drug use: Never   Sexual activity: Not on file  Other Topics Concern   Not on file  Social History Narrative   Graduated at [employer]   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   Social Determinants of Health   Financial Resource Strain: Not on file  Food Insecurity: Not on file  Transportation Needs: Not on file  Physical Activity: Not on file  Stress: Not on file  Social Connections: Not on file  Intimate Partner Violence: Not on file    Past Surgical History:  Procedure Laterality Date   BUNIONECTOMY Left 04/29/2020   Procedure: Left First metatarsal scarf osteotomy, akin osteotomy, modified McBride and cheilectomy;  Surgeon: Toni Arthurs, MD;  Location: [facility];  Service: Orthopedics;  Laterality: Left;    History reviewed. No pertinent family history.  No Known Allergies  Current Outpatient Medications on File Prior to Visit  Medication Sig Dispense Refill   FLUoxetine (PROZAC) 20 MG capsule TAKE 1 CAPSULE BY MOUTH EVERY DAY FOR ANXIETY AND DEPRESSION 30 capsule 0   levonorgestrel (MIRENA, 52 MG,) 20 MCG/24HR IUD Mirena 20 mcg/24 hours (6 yrs) 52 mg intrauterine device  Take 1 device by intrauterine route.     omeprazole (PRILOSEC) 20 MG capsule TAKE 1 CAPSULE BY MOUTH DAILY FOR ACID REFLUX. 30 capsule 0   rOPINIRole (REQUIP) 0.25 MG tablet TAKE 1 TABLET BY MOUTH AT BEDTIME. 30 tablet 1   No current facility-administered medications on file prior to visit.    BP 110/74   Pulse 86   Temp 98.3 F (36.8 C) (Temporal)   Ht  5\' 9"  (1.753 m)   Wt 218 lb (98.9 kg)   SpO2 98%   BMI 32.19 kg/m  Objective:   Physical Exam HENT:     Right Ear: Tympanic membrane and ear canal normal.     Left Ear: Tympanic membrane and ear canal normal.     Nose: Nose normal.  Eyes:     Conjunctiva/sclera: Conjunctivae normal.     Pupils: Pupils are equal, round, and reactive to light.  Neck:     Thyroid: No thyromegaly.  Cardiovascular:     Rate and Rhythm: Normal rate and regular rhythm.     Heart sounds: No murmur heard. Pulmonary:     Effort: Pulmonary effort is normal.     Breath sounds: Normal breath sounds. No rales.   Abdominal:     General: Bowel sounds are normal.     Palpations: Abdomen is soft.     Tenderness: There is no abdominal tenderness.  Musculoskeletal:        General: Normal range of motion.     Cervical back: Neck supple.  Lymphadenopathy:     Cervical: No cervical adenopathy.  Skin:    General: Skin is warm and dry.     Findings: No rash.  Neurological:     Mental Status: She is alert and oriented to person, place, and time.     Cranial Nerves: No cranial nerve deficit.     Deep Tendon Reflexes: Reflexes are normal and symmetric.  Psychiatric:        Mood and Affect: Mood normal.          Assessment & Plan:      This visit occurred during the SARS-CoV-2 public health emergency.  Safety protocols were in place, including screening questions prior to the visit, additional usage of staff PPE, and extensive cleaning of exam room while observing appropriate contact time as indicated for disinfecting solutions.

## 2021-01-25 NOTE — Patient Instructions (Signed)
Resume fluoxetine (Prozac) 20 mg daily for anxiety and depression.  Use the omeprazole 20 mg daily for heartburn for a period of time, then stop.   Use the Requip if needed for restless legs.  It was a pleasure to see you today!  Preventive Care 21-26 Years Old, Female Preventive care refers to lifestyle choices and visits with your health care provider that can promote health and wellness. This includes: A yearly physical exam. This is also called an annual wellness visit. Regular dental and eye exams. Immunizations. Screening for certain conditions. Healthy lifestyle choices, such as: Eating a healthy diet. Getting regular exercise. Not using drugs or products that contain nicotine and tobacco. Limiting alcohol use. What can I expect for my preventive care visit? Physical exam Your health care provider may check your: Height and weight. These may be used to calculate your BMI (body mass index). BMI is a measurement that tells if you are at a healthy weight. Heart rate and blood pressure. Body temperature. Skin for abnormal spots. Counseling Your health care provider may ask you questions about your: Past medical problems. Family's medical history. Alcohol, tobacco, and drug use. Emotional well-being. Home life and relationship well-being. Sexual activity. Diet, exercise, and sleep habits. Work and work Statistician. Access to firearms. Method of birth control. Menstrual cycle. Pregnancy history. What immunizations do I need? Vaccines are usually given at various ages, according to a schedule. Your health care provider will recommend vaccines for you based on your age, medical history, and lifestyle or other factors, such as travel or where you work. What tests do I need? Blood tests Lipid and cholesterol levels. These may be checked every 5 years starting at age 14. Hepatitis C test. Hepatitis B test. Screening Diabetes screening. This is done by checking your blood  sugar (glucose) after you have not eaten for a while (fasting). STD (sexually transmitted disease) testing, if you are at risk. BRCA-related cancer screening. This may be done if you have a family history of breast, ovarian, tubal, or peritoneal cancers. Pelvic exam and Pap test. This may be done every 3 years starting at age 89. Starting at age 41, this may be done every 5 years if you have a Pap test in combination with an HPV test. Talk with your health care provider about your test results, treatment options, and if necessary, the need for more tests. Follow these instructions at home: Eating and drinking  Eat a healthy diet that includes fresh fruits and vegetables, whole grains, lean protein, and low-fat dairy products. Take vitamin and mineral supplements as recommended by your health care provider. Do not drink alcohol if: Your health care provider tells you not to drink. You are pregnant, may be pregnant, or are planning to become pregnant. If you drink alcohol: Limit how much you have to 0-1 drink a day. Be aware of how much alcohol is in your drink. In the [place] one drink equals one 12 oz bottle of beer (355 mL), one 5 oz glass of wine (148 mL), or one 1 oz glass of hard liquor (44 mL). Lifestyle Take daily care of your teeth and gums. Brush your teeth every morning and night with fluoride toothpaste. Floss one time each day. Stay active. Exercise for at least 30 minutes 5 or more days each week. Do not use any products that contain nicotine or tobacco, such as cigarettes, e-cigarettes, and chewing tobacco. If you need help quitting, ask your health care provider. Do not use drugs. If  you are sexually active, practice safe sex. Use a condom or other form of protection to prevent STIs (sexually transmitted infections). If you do not wish to become pregnant, use a form of birth control. If you plan to become pregnant, see your health care provider for a prepregnancy visit. Find  healthy ways to cope with stress, such as: Meditation, yoga, or listening to music. Journaling. Talking to a trusted person. Spending time with friends and family. Safety Always wear your seat belt while driving or riding in a vehicle. Do not drive: If you have been drinking alcohol. Do not ride with someone who has been drinking. When you are tired or distracted. While texting. Wear a helmet and other protective equipment during sports activities. If you have firearms in your house, make sure you follow all gun safety procedures. Seek help if you have been physically or sexually abused. What's next? Go to your health care provider once a year for an annual wellness visit. Ask your health care provider how often you should have your eyes and teeth checked. Stay up to date on all vaccines. This information is not intended to replace advice given to you by your health care provider. Make sure you discuss any questions you have with your health care provider. Document Revised: 05/28/2020 Document Reviewed: 11/29/2017 Elsevier Patient Education  2022 [employer].

## 2021-01-25 NOTE — Assessment & Plan Note (Signed)
Chronic, symptoms returned a few months ago. Has been out of omeprazole for months.   Refills provided for omeprazole 20 mg. She uses PRN for 1-2 months.

## 2021-01-25 NOTE — Assessment & Plan Note (Signed)
No concerns, no recent migraines.   Continue to monitor.  Continue off treatment.

## 2021-01-25 NOTE — Assessment & Plan Note (Signed)
Tetanus and influenza vaccine provided today. Pap smear UTD. Follows with GYN.   Discussed the importance of a healthy diet and regular exercise in order for weight loss, and to reduce the risk of further co-morbidity.  Exam today unremarkable.

## 2021-01-25 NOTE — Assessment & Plan Note (Signed)
Out of fluoxetine 20 mg for months, would like to resume. Refills sent to pharmacy.   She will update if no improvement.

## 2021-01-25 NOTE — Assessment & Plan Note (Signed)
No concerns today, has been off of Requip for months.   Will send refill to use if needed. Continue Requip 0.25 mg PRN.

## 2021-02-15 NOTE — Telephone Encounter (Signed)
Please call patient to set up virtual

## 2021-02-15 NOTE — Telephone Encounter (Signed)
Called patient and left vm.

## 2021-02-16 NOTE — Telephone Encounter (Signed)
LMTCB to schedule an appt.

## 2021-02-21 ENCOUNTER — Other Ambulatory Visit: Payer: Self-pay | Admitting: Primary Care

## 2021-02-21 DIAGNOSIS — G2581 Restless legs syndrome: Secondary | ICD-10-CM

## 2021-03-02 ENCOUNTER — Other Ambulatory Visit: Payer: Self-pay

## 2021-03-02 ENCOUNTER — Encounter: Payer: Self-pay | Admitting: Primary Care

## 2021-03-02 ENCOUNTER — Telehealth (INDEPENDENT_AMBULATORY_CARE_PROVIDER_SITE_OTHER): Payer: 59 | Admitting: Primary Care

## 2021-03-02 VITALS — Ht 69.0 in | Wt 218.0 lb

## 2021-03-02 DIAGNOSIS — E669 Obesity, unspecified: Secondary | ICD-10-CM | POA: Insufficient documentation

## 2021-03-02 DIAGNOSIS — E66811 Obesity, class 1: Secondary | ICD-10-CM | POA: Insufficient documentation

## 2021-03-02 NOTE — Assessment & Plan Note (Signed)
68 pound weight gain since 2018, likely due to excess calories given her diet and eating habits.  TSH from 2021 normal, will recheck this year as well.  We will also check CMP and A1c.  Discussed the absolute need to work on her diet by cutting back on Pasta, soda, snacking.  Increase water intake and exercise.  Agreed to start [facility] once weekly as long as labs are stable.  We discussed potential side effects.  We will also see her back in 6 weeks for weight check and follow-up.  She agrees.

## 2021-03-02 NOTE — Patient Instructions (Signed)
Complete your labs at the [facility] [place] office as discussed.  Once I have your labs I will send you directions regarding the medication.  You must work on [employer], cut back on Pasta, stop drinking soda, increase water consumption.  Please set up a 6-week follow-up visit when she start the medication.  It was a pleasure to see you today!

## 2021-03-02 NOTE — Progress Notes (Signed)
Patient ID: Laurie Burns, female    DOB: 06-11-1995, 25 y.o.   MRN: [id number]  Virtual visit completed through [employer], a video enabled telemedicine application. Due to national recommendations of social distancing due to COVID-19, a virtual visit is felt to be most appropriate for this patient at this time. Reviewed limitations, risks, security and privacy concerns of performing a virtual visit and the availability of in person appointments. I also reviewed that there may be a patient responsible charge related to this service. The patient agreed to proceed.   Patient location: Work Provider location: Adult nurse at [employer], office Persons participating in this virtual visit: patient, provider   If any vitals were documented, they were collected by patient at home unless specified below.    Ht 5\' 9"  (1.753 m)   Wt 218 lb (98.9 kg)   BMI 32.19 kg/m    CC: Discuss obesity Subjective:   HPI: is a 25 y.o. female with a history of migraines, anxiety, depression, obesity, restless leg syndrome presenting on 03/02/2021 for Weight Loss 03/04/2021 like to discuss medication options )  She is frustrated with her weight, has struggled with obesity for the last several years. She has food cravings, was snacking often, eating large portion sizes, drinks lots of soda and is trying to cut back. She did cut out alcohol about 10 days ago, endorses drinking a lot of beer during football season.  Her mother is on Mounjaro for weight loss so she's curious about trying something similar.   She's tried the Keto Diet, Optavia, essential oil supplement (MetPower), weight watches, calorie counting with temporary improvement. Has a hard time sticking with anything.   Diet currently consists of:  Breakfast: Protein bar, black coffee Lunch: Fast food, take out food Dinner: Pasta, cheese, chicken, some veggies  Snacks: None Desserts: Once weekly  Beverages: Soda, some water, beer  (recently cut back)  Exercise: Walking some  Wt Readings from Last 3 Encounters:  03/02/21 218 lb (98.9 kg)  01/25/21 218 lb (98.9 kg)  04/29/20 204 lb 9.4 oz (92.8 kg)   She is weighing herself once weekly.       Relevant past medical, surgical, family and social history reviewed and updated as indicated. Interim medical history since our last visit reviewed. Allergies and medications reviewed and updated. Outpatient Medications Prior to Visit  Medication Sig Dispense Refill   FLUoxetine (PROZAC) 20 MG capsule TAKE 1 CAPSULE BY MOUTH EVERY DAY FOR ANXIETY AND DEPRESSION 90 capsule 3   levonorgestrel (MIRENA, 52 MG,) 20 MCG/24HR IUD Mirena 20 mcg/24 hours (6 yrs) 52 mg intrauterine device  Take 1 device by intrauterine route.     omeprazole (PRILOSEC) 20 MG capsule Take 1 capsule (20 mg total) by mouth daily as needed. For heartburn. 90 capsule 0   rOPINIRole (REQUIP) 0.25 MG tablet Take 1 tablet (0.25 mg total) by mouth at bedtime. For restless legs. 30 tablet 0   No facility-administered medications prior to visit.     Per HPI unless specifically indicated in ROS section below Review of Systems  Respiratory:  Negative for shortness of breath.   Cardiovascular:  Negative for chest pain.  Neurological:  Negative for dizziness.  Psychiatric/Behavioral:  The patient is not nervous/anxious.   Objective:  Ht 5\' 9"  (1.753 m)   Wt 218 lb (98.9 kg)   BMI 32.19 kg/m   Wt Readings from Last 3 Encounters:  03/02/21 218 lb (98.9 kg)  01/25/21 218 lb (  98.9 kg)  04/29/20 204 lb 9.4 oz (92.8 kg)       Physical exam: General: Alert and oriented x 3, no distress, does not appear sickly  Pulmonary: Speaks in complete sentences without increased work of breathing, no cough during visit.  Psychiatric: Normal mood, thought content, and behavior.     Results for orders placed or performed during the hospital encounter of 04/29/20  Pregnancy, urine POC  Result Value Ref Range   Preg  Test, Ur NEGATIVE NEGATIVE   Assessment & Plan:   Problem List Items Addressed This Visit       Other   Obesity (BMI 30.0-34.9) - Primary    68 pound weight gain since 2018, likely due to excess calories given her diet and eating habits.  TSH from 2021 normal, will recheck this year as well.  We will also check CMP and A1c.  Discussed the absolute need to work on her diet by cutting back on Pasta, soda, snacking.  Increase water intake and exercise.  Agreed to start [facility] once weekly as long as labs are stable.  We discussed potential side effects.  We will also see her back in 6 weeks for weight check and follow-up.  She agrees.      Relevant Orders   Comprehensive metabolic panel   Hemoglobin A1c   CBC   TSH     No orders of the defined types were placed in this encounter.  Orders Placed This Encounter  Procedures   Comprehensive metabolic panel    Standing Status:   Future    Standing Expiration Date:   03/02/2022   Hemoglobin A1c    Standing Status:   Future    Standing Expiration Date:   03/02/2022   CBC    Standing Status:   Future    Standing Expiration Date:   03/02/2022   TSH    Standing Status:   Future    Standing Expiration Date:   03/02/2022     I discussed the assessment and treatment plan with the patient. The patient was provided an opportunity to ask questions and all were answered. The patient agreed with the plan and demonstrated an understanding of the instructions. The patient was advised to call back or seek an in-person evaluation if the symptoms worsen or if the condition fails to improve as anticipated.  Follow up plan:  Complete your labs at the [facility] office as discussed.  Once I have your labs I will send you directions regarding the medication.  You must work on [employer], cut back on Pasta, stop drinking soda, increase water consumption.  Please set up a 6-week follow-up visit when she start the medication.  It  was a pleasure to see you today!   Doreene Nest, NP

## 2021-03-29 ENCOUNTER — Other Ambulatory Visit: Payer: 59

## 2021-04-22 ENCOUNTER — Other Ambulatory Visit: Payer: Self-pay | Admitting: Primary Care

## 2021-04-22 DIAGNOSIS — K219 Gastro-esophageal reflux disease without esophagitis: Secondary | ICD-10-CM

## 2021-06-14 ENCOUNTER — Other Ambulatory Visit: Payer: Self-pay

## 2021-06-14 ENCOUNTER — Encounter: Payer: Self-pay | Admitting: Internal Medicine

## 2021-06-14 ENCOUNTER — Ambulatory Visit (INDEPENDENT_AMBULATORY_CARE_PROVIDER_SITE_OTHER): Payer: 59 | Admitting: Internal Medicine

## 2021-06-14 DIAGNOSIS — S300XXA Contusion of lower back and pelvis, initial encounter: Secondary | ICD-10-CM | POA: Diagnosis not present

## 2021-06-14 NOTE — Patient Instructions (Signed)
You can over the counter diclofenac gel on the sore spots ?

## 2021-06-14 NOTE — Progress Notes (Signed)
? ?  Subjective:  ? ? Patient ID: Laurie Burns, female    DOB: 1995-10-02, 26 y.o.   MRN: [id number] ? ?HPI ?Here due to pain over tailbone--after falls while snowboarding ? ?Husband took her snowboarding--never did before ?He took her to a more advanced run and it took her a long time to go down and fell repeatedly fell onto her butt ?Fell onto knees once ?Thinks the last fall was worse---on ice ?Felt nauseated---sat there for a while (took the board off) ?Needed help from her husband to get up ?Didn't board any after that ?Did ice then ?Using ibuprofen 800mg  every 4-6 hours ?Trying to stay off it--very tender ?Does have some bruising to the left of coccyx and to buttock ? ?Current Outpatient Medications on File Prior to Visit  ?Medication Sig Dispense Refill  ? FLUoxetine (PROZAC) 20 MG capsule TAKE 1 CAPSULE BY MOUTH EVERY DAY FOR ANXIETY AND DEPRESSION 90 capsule 3  ? omeprazole (PRILOSEC) 20 MG capsule TAKE 1 CAPSULE (20 MG TOTAL) BY MOUTH DAILY AS NEEDED FOR HEARTBURN. 90 capsule 0  ? rOPINIRole (REQUIP) 0.25 MG tablet Take 1 tablet (0.25 mg total) by mouth at bedtime. For restless legs. 30 tablet 0  ? ?No current facility-administered medications on file prior to visit.  ? ? ?No Known Allergies ? ?Past Medical History:  ?Diagnosis Date  ? GERD (gastroesophageal reflux disease)   ? ? ?Past Surgical History:  ?Procedure Laterality Date  ? BUNIONECTOMY Left 04/29/2020  ? Procedure: Left First metatarsal scarf osteotomy, akin osteotomy, modified McBride and cheilectomy;  Surgeon: 05/01/2020, MD;  Location: [facility];  Service: Orthopedics;  Laterality: Left;  ? ? ?No family history on file. ? ?Social History  ? ?Socioeconomic History  ? Marital status: Married  ?  Spouse name: Not on file  ? Number of children: Not on file  ? Years of education: Not on file  ? Highest education level: Not on file  ?Occupational History  ? Not on file  ?Tobacco Use  ? Smoking status: Never  ?  Passive exposure:  Past  ? Smokeless tobacco: Never  ?Vaping Use  ? Vaping Use: Every day  ? Substances: Nicotine  ?Substance and Sexual Activity  ? Alcohol use: No  ? Drug use: Never  ? Sexual activity: Not on file  ?Other Topics Concern  ? Not on file  ?Social History Narrative  ? Graduated at [employer]  ? ?Social Determinants of Health  ? ?Financial Resource Strain: Not on file  ?Food Insecurity: Not on file  ?Transportation Needs: Not on file  ?Physical Activity: Not on file  ?Stress: Not on file  ?Social Connections: Not on file  ?Intimate Partner Violence: Not on file  ? ?Review of Systems ?Some hip aching ?Restless legs worse since then despite ropinorole ? ?   ?Objective:  ? Physical Exam ?Constitutional:   ?   Appearance: Normal appearance.  ?Musculoskeletal:  ?   Comments: Some tenderness over lumbar spine (from L2 down) and slightly more over coccyx (but not striking) ?Mild bruising over left convex buttock ?Normal ROM in hips  ?Neurological:  ?   Mental Status: She is alert.  ?   Comments: Normal gait but slow ?No leg weakness  ?  ? ? ? ? ?   ?Assessment & Plan:  ? ?

## 2021-06-14 NOTE — Assessment & Plan Note (Signed)
Repeated falls  ?Sore but no worrisome findings ?Okay to continue ibuprofen 800mg  up to three times a day ?May get some benefit from heat ?Can try diclofenac gel topical ?

## 2021-10-10 IMAGING — DX DG HAND COMPLETE 3+V*R*
3 series · 3 of 3 positions shown · non-contrast
Comparison: None

CLINICAL DATA: RIGHT hand pain post crush injury

EXAM:
RIGHT HAND - COMPLETE 3+ VIEW

[hand pa]
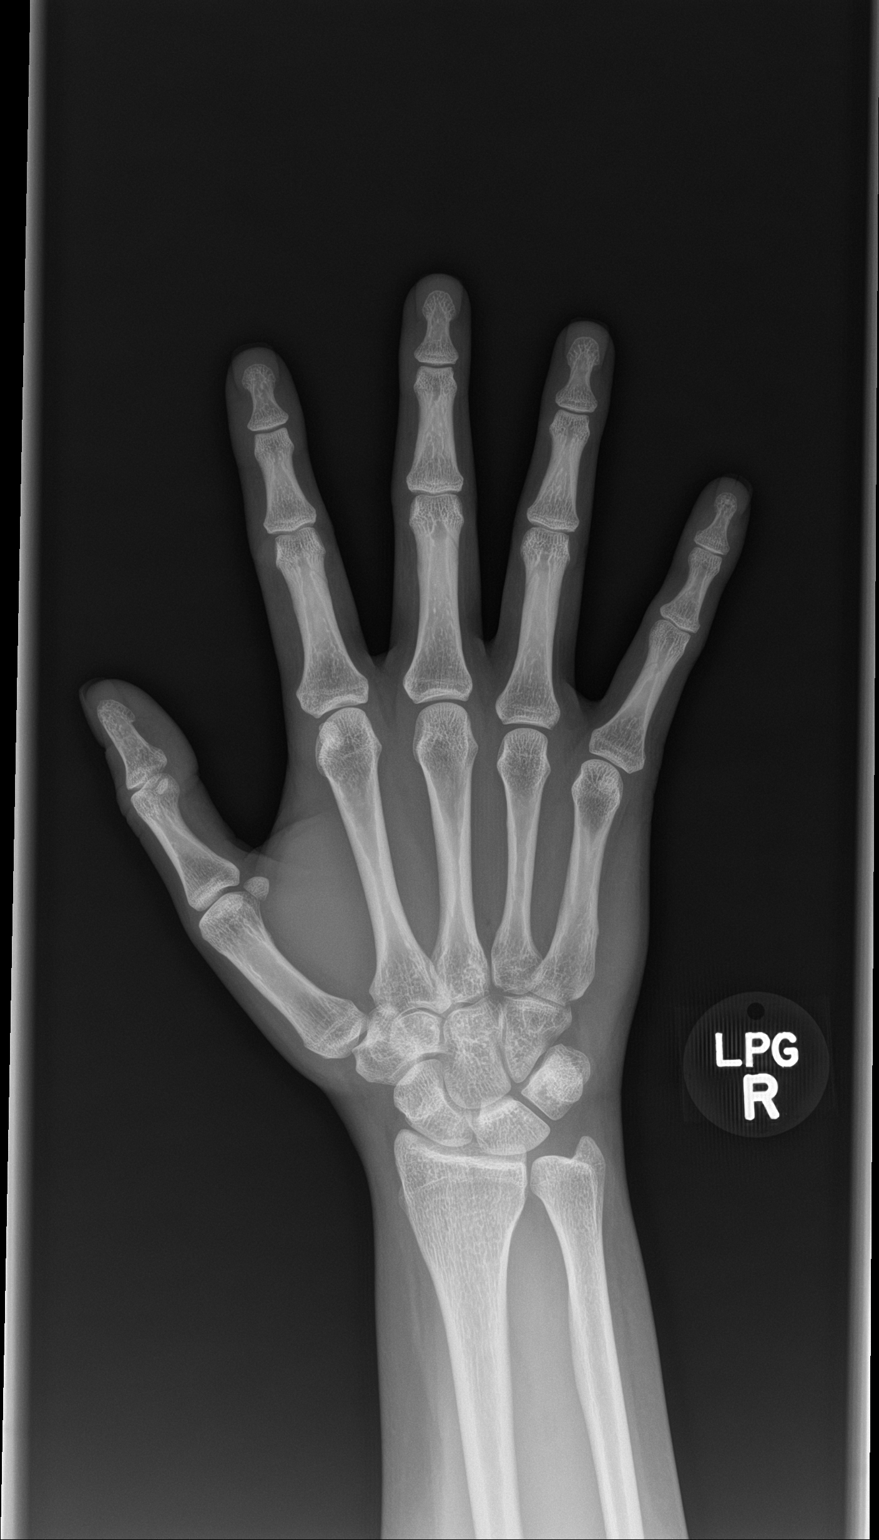

[hand obl]
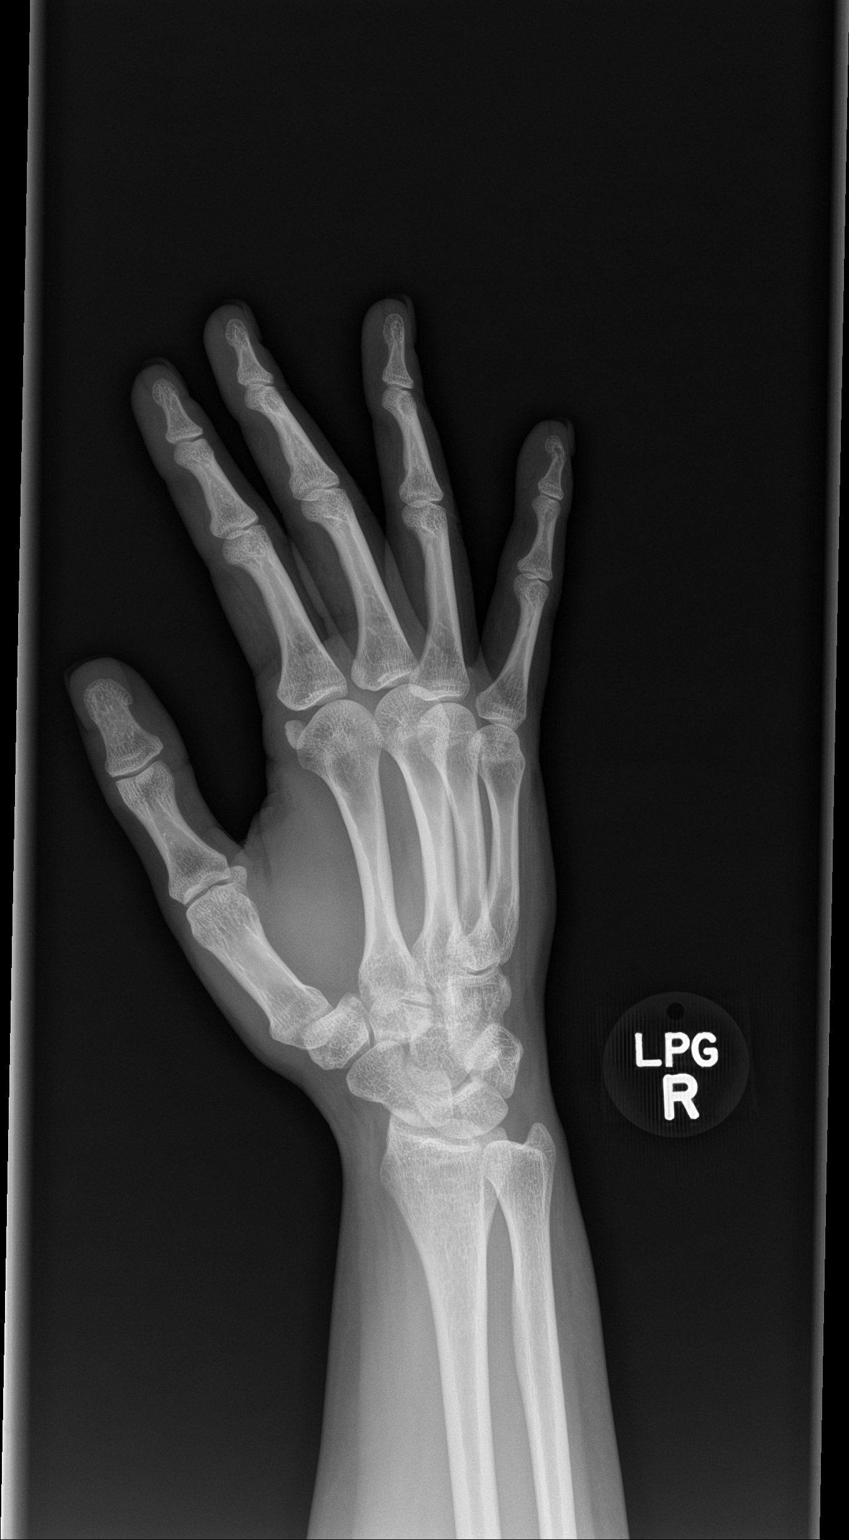

[hand lat]
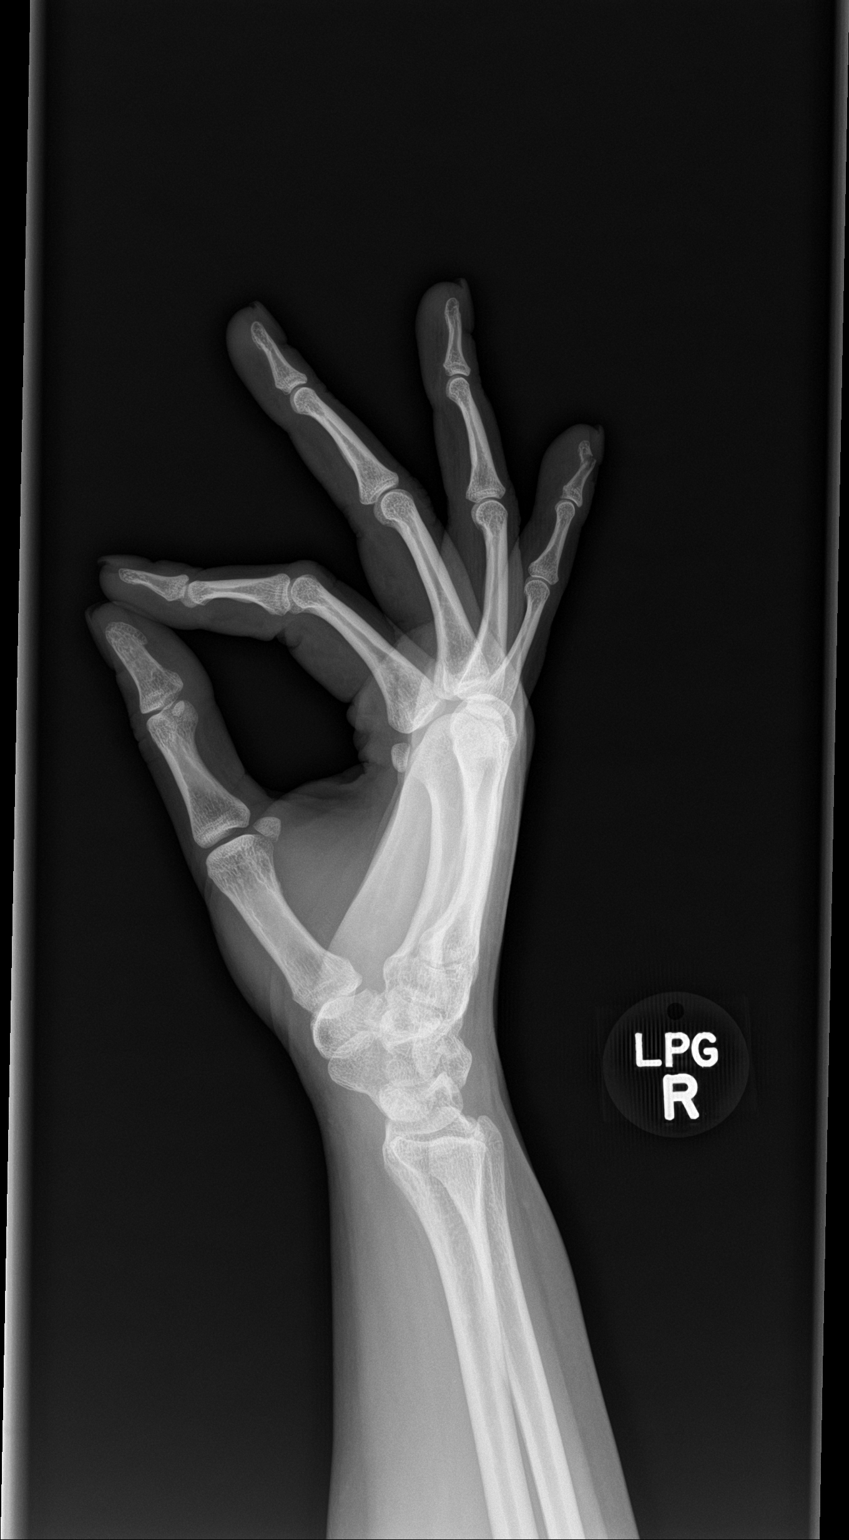

[3 of 3 positions shown; findings below may reference images not displayed]

FINDINGS: Osseous mineralization normal.

Joint spaces preserved.

Displaced fracture at tuft of distal phalanx RIGHT little finger
with mild apex dorsal angulation.

No additional fracture, dislocation, or bone destruction.
IMPRESSION: Displaced and mildly angulated fracture at tuft of distal phalanx
RIGHT little finger.

## 2021-10-20 ENCOUNTER — Ambulatory Visit (INDEPENDENT_AMBULATORY_CARE_PROVIDER_SITE_OTHER): Payer: 59 | Admitting: Podiatry

## 2021-10-20 ENCOUNTER — Ambulatory Visit (INDEPENDENT_AMBULATORY_CARE_PROVIDER_SITE_OTHER): Payer: 59

## 2021-10-20 DIAGNOSIS — M7671 Peroneal tendinitis, right leg: Secondary | ICD-10-CM | POA: Diagnosis not present

## 2021-10-20 NOTE — Progress Notes (Signed)
  Subjective:  Patient ID: Laurie Burns, female    DOB: 08-01-1995,  MRN: [id number]  No chief complaint on file.   26 y.o. female presents with the above complaint.  Patient presents with complaint of right lateral ankle pain.  Patient states painful to touch is progressive gotten worse.  She was working by her house and twisted her foot.  She states been painful.  She thought it was getting get better but it started hurting again.  She denies any other acute complaints she has not seen anyone else prior to seeing me.  She denies any other treatment options.  She has not immobilized it.   Review of Systems: Negative except as noted in the HPI. Denies N/V/F/Ch.  Past Medical History:  Diagnosis Date   GERD (gastroesophageal reflux disease)     Current Outpatient Medications:    FLUoxetine (PROZAC) 20 MG capsule, TAKE 1 CAPSULE BY MOUTH EVERY DAY FOR ANXIETY AND DEPRESSION, Disp: 90 capsule, Rfl: 3   omeprazole (PRILOSEC) 20 MG capsule, TAKE 1 CAPSULE (20 MG TOTAL) BY MOUTH DAILY AS NEEDED FOR HEARTBURN., Disp: 90 capsule, Rfl: 0   rOPINIRole (REQUIP) 0.25 MG tablet, Take 1 tablet (0.25 mg total) by mouth at bedtime. For restless legs., Disp: 30 tablet, Rfl: 0  Social History   Tobacco Use  Smoking Status Never   Passive exposure: Past  Smokeless Tobacco Never    No Known Allergies Objective:  There were no vitals filed for this visit. There is no height or weight on file to calculate BMI. Constitutional Well developed. Well nourished.  Vascular Dorsalis pedis pulses palpable bilaterally. Posterior tibial pulses palpable bilaterally. Capillary refill normal to all digits.  No cyanosis or clubbing noted. Pedal hair growth normal.  Neurologic Normal speech. Oriented to person, place, and time. Epicritic sensation to light touch grossly present bilaterally.  Dermatologic Nails well groomed and normal in appearance. No open wounds. No skin lesions.  Orthopedic: Pain on  palpation along the course of the peroneal tendon.  Pain with resisted eversion of the foot no pain with plantarflexion inversion of the foot no pain at the ATFL ligament, Achilles tendon,  Posterior tibial tendon.   Radiographs: 3 views of skeletally mature the right foot: No fractures noted.  No bony abnormalities identified no foreign body noted Assessment:   1. Peroneal tendinitis of lower leg, right    Plan:  Patient was evaluated and treated and all questions answered.  Right peroneal tendinitis -All questions and concerns were discussed with the patient in extensive detail given the amount of pain that she is having she will benefit from cam boot immobilization to help decrease acute inflammatory component associate with pain.  Patient agrees with plan to proceed with cam boot immobilization -Cam boot was dispensed  No follow-ups on file.

## 2021-11-17 ENCOUNTER — Ambulatory Visit: Payer: 59 | Admitting: Podiatry

## 2022-04-12 ENCOUNTER — Telehealth: Payer: Self-pay | Admitting: Primary Care

## 2022-04-12 DIAGNOSIS — K219 Gastro-esophageal reflux disease without esophagitis: Secondary | ICD-10-CM

## 2022-04-12 MED ORDER — OMEPRAZOLE 20 MG PO CPDR: 20.0000 mg | DELAYED_RELEASE_CAPSULE | Freq: Every day | ORAL | 0 refills | Status: DC | PRN

## 2022-04-12 NOTE — Telephone Encounter (Signed)
Prescription Request  04/12/2022  Is this a "Controlled Substance" medicine? No  LOV: Visit date not found  What is the name of the medication or equipment?   omeprazole (PRILOSEC) 20 MG capsule   Have you contacted your pharmacy to request a refill? No   Which pharmacy would you like this sent to?  [id number] [place], [place] [address]     Patient notified that their request is being sent to the clinical staff for review and that they should receive a response within 2 business days.   Please advise at Mobile [phone] (mobile)

## 2022-04-12 NOTE — Addendum Note (Signed)
Addended by: Pleas Koch on: 04/12/2022 11:21 AM   Modules accepted: Orders

## 2022-04-19 ENCOUNTER — Encounter: Payer: Self-pay | Admitting: Primary Care

## 2022-04-19 ENCOUNTER — Ambulatory Visit (INDEPENDENT_AMBULATORY_CARE_PROVIDER_SITE_OTHER): Payer: 59 | Admitting: Primary Care

## 2022-04-19 VITALS — BP 102/68 | HR 79 | Temp 97.9°F | Ht 69.0 in | Wt 218.0 lb

## 2022-04-19 DIAGNOSIS — R1032 Left lower quadrant pain: Secondary | ICD-10-CM | POA: Insufficient documentation

## 2022-04-19 DIAGNOSIS — R102 Pelvic and perineal pain: Secondary | ICD-10-CM

## 2022-04-19 LAB — CBC WITH DIFFERENTIAL/PLATELET
Basophils Absolute: 0 10*3/uL (ref 0.0–0.1)
Basophils Relative: 0.5 % (ref 0.0–3.0)
Eosinophils Absolute: 0.1 10*3/uL (ref 0.0–0.7)
Eosinophils Relative: 1.5 % (ref 0.0–5.0)
HCT: 42.3 % (ref 36.0–46.0)
Hemoglobin: 14.3 g/dL (ref 12.0–15.0)
Lymphocytes Relative: 35.8 % (ref 12.0–46.0)
Lymphs Abs: 1.7 10*3/uL (ref 0.7–4.0)
MCHC: 33.7 g/dL (ref 30.0–36.0)
MCV: 90.5 fl (ref 78.0–100.0)
Monocytes Absolute: 0.4 10*3/uL (ref 0.1–1.0)
Monocytes Relative: 9 % (ref 3.0–12.0)
Neutro Abs: 2.6 10*3/uL (ref 1.4–7.7)
Neutrophils Relative %: 53.2 % (ref 43.0–77.0)
Platelets: 201 10*3/uL (ref 150.0–400.0)
RBC: 4.68 Mil/uL (ref 3.87–5.11)
RDW: 13 % (ref 11.5–15.5)
WBC: 4.8 10*3/uL (ref 4.0–10.5)

## 2022-04-19 LAB — POC URINALSYSI DIPSTICK (AUTOMATED)
Bilirubin, UA: NEGATIVE
Blood, UA: NEGATIVE
Glucose, UA: NEGATIVE
Ketones, UA: NEGATIVE
Leukocytes, UA: NEGATIVE
Nitrite, UA: NEGATIVE
Protein, UA: NEGATIVE
Spec Grav, UA: 1.025 (ref 1.010–1.025)
Urobilinogen, UA: NEGATIVE E.U./dL — AB
pH, UA: 5 (ref 5.0–8.0)

## 2022-04-19 LAB — HCG, QUANTITATIVE, PREGNANCY: Quantitative HCG: 0.85 m[IU]/mL

## 2022-04-19 LAB — POCT URINE PREGNANCY: Preg Test, Ur: NEGATIVE

## 2022-04-19 LAB — BASIC METABOLIC PANEL
BUN: 10 mg/dL (ref 6–23)
CO2: 26 mEq/L (ref 19–32)
Calcium: 9.2 mg/dL (ref 8.4–10.5)
Chloride: 104 mEq/L (ref 96–112)
Creatinine, Ser: 0.83 mg/dL (ref 0.40–1.20)
GFR: 96.57 mL/min (ref >60.00)
Glucose, Bld: 90 mg/dL (ref 70–99)
Potassium: 4.8 mEq/L (ref 3.5–5.1)
Sodium: 138 mEq/L (ref 135–145)

## 2022-04-19 NOTE — Progress Notes (Signed)
Subjective:    Patient ID: Laurie Burns, female    DOB: 07/15/1995, 27 y.o.   MRN: [id number]  Abdominal Pain Associated symptoms include nausea. Pertinent negatives include no constipation, diarrhea, dysuria, fever or vomiting.    Laurie Burns is a very pleasant 27 y.o. female  has a past medical history of GERD (gastroesophageal reflux disease). who presents today to discuss abdominal pain.  Her pain is located to the left lower quadrant which began about two weeks ago. Pain is intermittent. She's also noticed esophageal burning, nausea without vomiting. She denies dysuria, hematuria, urinary frequency. Initially she was unable to pass gas. Over the last week she's noticed a reduction in her LLQ abdominal pain and is able to pass gas. Her last bowel movement was yesterday and was per her norm.   She's been taking omeprazole sparingly, Pepto Bismol, and Tums frequently without relief. She has a family history of diverticulosis in her mother.   She questions if her symptoms are secondary to pregnancy. LMP was 03/23/22, lasted two days with light flow which is not normal as typical cycles are longer. She had her IUD removed in January 2023 for intended pregnancy. She took a home pregnancy test recently which was negative. She does follow with OB/GYN, appointment due in February 2024. She denies breakthrough vaginal bleeding.   Review of Systems  Constitutional:  Negative for chills, fatigue and fever.  Gastrointestinal:  Positive for abdominal pain and nausea. Negative for blood in stool, constipation, diarrhea and vomiting.  Genitourinary:  Negative for difficulty urinating and dysuria.         Past Medical History:  Diagnosis Date   GERD (gastroesophageal reflux disease)     Social History   Socioeconomic History   Marital status: Married    Spouse name: Not on file   Number of children: Not on file   Years of education: Not on file   Highest education level: Not on  file  Occupational History   Occupation: Glass blower/designer    Comment: [employer]  Tobacco Use   Smoking status: Never    Passive exposure: Past   Smokeless tobacco: Never  Vaping Use   Vaping Use: Every day   Substances: Nicotine  Substance and Sexual Activity   Alcohol use: No   Drug use: Never   Sexual activity: Not on file  Other Topics Concern   Not on file  Social History Narrative   Graduated at [employer]   Social Determinants of Health   Financial Resource Strain: Not on file  Food Insecurity: Not on file  Transportation Needs: Not on file  Physical Activity: Not on file  Stress: Not on file  Social Connections: Not on file  Intimate Partner Violence: Not on file    Past Surgical History:  Procedure Laterality Date   BUNIONECTOMY Left 04/29/2020   Procedure: Left First metatarsal scarf osteotomy, akin osteotomy, modified McBride and cheilectomy;  Surgeon: Wylene Simmer, MD;  Location: [place];  Service: Orthopedics;  Laterality: Left;    History reviewed. No pertinent family history.  No Known Allergies  Current Outpatient Medications on File Prior to Visit  Medication Sig Dispense Refill   omeprazole (PRILOSEC) 20 MG capsule Take 1 capsule (20 mg total) by mouth daily as needed. For heartburn. 30 capsule 0   No current facility-administered medications on file prior to visit.    BP 102/68   Pulse 79   Temp 97.9 F (36.6 C) (Temporal)  Ht 5\' 9"  (1.753 m)   Wt 218 lb (98.9 kg)   LMP 03/22/2022 (Exact Date) Comment: Patient states she is late for her mestrual cycle this month  SpO2 98%   BMI 32.19 kg/m  Objective:   Physical Exam Constitutional:      General: She is not in acute distress.    Appearance: She is not ill-appearing.  Cardiovascular:     Rate and Rhythm: Normal rate.  Pulmonary:     Effort: Pulmonary effort is normal.  Abdominal:     General: Bowel sounds are normal.     Palpations: Abdomen is soft.      Tenderness: There is abdominal tenderness in the suprapubic area and left lower quadrant.    Skin:    General: Skin is warm and dry.  Neurological:     Mental Status: She is alert.           Assessment & Plan:  Left lower quadrant abdominal pain Assessment & Plan: Differentials include diverticulitis, pregnancy, ectopic pregnancy, urinary tract infection.   POC Urine dipstick and urine pregnancy was negative.   CBC with diff, BMP and HCG quantitative pending.   Consider CT scan Abdomen/Pelvic STAT to evaluate for diverticulitis/other cause for symptoms once HCG quantitative is resulted.  She will update if symptoms worsen. Fortunately, her symptoms have improved. She appears stable for outpatient treatment.  I evaluated patient, was consulted regarding treatment, and agree with assessment and plan per Tinnie Gens, RN, DNP student.   Allie Bossier, NP-C   Orders: -     hCG, quantitative, pregnancy -     CBC with Differential/Platelet -     Basic metabolic panel -     POCT Urinalysis Dipstick (Automated) -     POCT urine pregnancy        Pleas Koch, NP

## 2022-04-19 NOTE — Assessment & Plan Note (Addendum)
Differentials include diverticulitis, pregnancy, ectopic pregnancy, urinary tract infection.   POC Urine dipstick and urine pregnancy was negative.   CBC with diff, BMP and HCG quantitative pending.   Consider CT scan Abdomen/Pelvic STAT to evaluate for diverticulitis/other cause for symptoms once HCG quantitative is resulted.  She will update if symptoms worsen. Fortunately, her symptoms have improved. She appears stable for outpatient treatment.  I evaluated patient, was consulted regarding treatment, and agree with assessment and plan per Tinnie Gens, RN, DNP student.   Allie Bossier, NP-C

## 2022-04-19 NOTE — Patient Instructions (Signed)
Stop by the lab prior to leaving today. I will notify you of your results once received.   We will consider ordering a CT scan abdomen pelvis STAT once we get the HCG quantitative results back.   Please update if your symptoms worsen.   It was a pleasure to see you today!

## 2022-04-19 NOTE — Progress Notes (Signed)
Established Patient Office Visit  Subjective   Patient ID: Laurie Burns, female    DOB: 01/27/1996  Age: 27 y.o. MRN: [id number]  Chief Complaint  Patient presents with   Abdominal Pain    Left lower abdominal pain, she states it feels like we she has to pass gas but cannot. Sometimes she gets a wave of nausea. Started 2 weeks ago and first episode lasted 4-5 days. Since then the pain has been on and off    Abdominal Pain Associated symptoms include nausea. Pertinent negatives include no constipation, dysuria, fever, frequency, headaches, hematuria or vomiting.    Laurie Burns is a 26 year old female with past medical history of GERD, anxiety and depression, obesity presents today to discuss abdominal pain.   Reports she started to have left lower quadrant pain started two weeks ago. First episode lasted for 4-5 days. Her symptoms included unable to pass gas, nausea without vomiting and esophageal burning. Since then her symptoms have improved, she has been able to pass gas. Her last bowel movement was yesterday which was loose which is normal for her. She tried pepto bismol and tums without any relief. She denies any changes in bowel habits, constipation, changes to diet or lifting or moving anything heavy. Her mom has a history of diverticulitis and kidney cancer. Denies any known history of colon cancer.  Last menstrual period on 03/23/22 and lasted two days with light flow. Typically, her period is five days with mod to heavy flow. Her previous cycles are usually every 28-30 days; the one in December was on the 40th day. She had her IUD removed in January 2023 in attempts to get pregnant. She took a home pregnancy test three days ago and it was negative. She has an OBGYN appointment on 06/01/22.     Patient Active Problem List   Diagnosis Date Noted   Left lower quadrant abdominal pain 04/19/2022   Contusion of coccyx 06/14/2021   Obesity (BMI 30.0-34.9) 03/02/2021   Preventative  health care 01/25/2021   Gastroesophageal reflux disease 12/01/2019   Pain in right finger(s) 10/28/2019   Restless leg syndrome 07/30/2019   Anxiety and depression 12/28/2017   Migraines 12/28/2017   Past Medical History:  Diagnosis Date   GERD (gastroesophageal reflux disease)    Past Surgical History:  Procedure Laterality Date   BUNIONECTOMY Left 04/29/2020   Procedure: Left First metatarsal scarf osteotomy, akin osteotomy, modified McBride and cheilectomy;  Surgeon: Wylene Simmer, MD;  Location: [facility];  Service: Orthopedics;  Laterality: Left;   Social History   Tobacco Use   Smoking status: Never    Passive exposure: Past   Smokeless tobacco: Never  Vaping Use   Vaping Use: Every day   Substances: Nicotine  Substance Use Topics   Alcohol use: No   Drug use: Never   History reviewed. No pertinent family history. No Known Allergies    Review of Systems  Constitutional:  Negative for chills and fever.  Respiratory:  Negative for shortness of breath.   Cardiovascular:  Negative for chest pain.  Gastrointestinal:  Positive for abdominal pain and nausea. Negative for blood in stool, constipation, heartburn and vomiting.  Genitourinary:  Negative for dysuria, frequency and hematuria.  Neurological:  Negative for dizziness and headaches.      Objective:     BP 102/68   Pulse 79   Temp 97.9 F (36.6 C) (Temporal)   Ht 5\' 9"  (1.753 m)   Wt 218  lb (98.9 kg)   LMP 03/22/2022 (Exact Date) Comment: Patient states she is late for her mestrual cycle this month  SpO2 98%   BMI 32.19 kg/m  BP Readings from Last 3 Encounters:  04/19/22 102/68  06/14/21 100/74  01/25/21 110/74   Wt Readings from Last 3 Encounters:  04/19/22 218 lb (98.9 kg)  06/14/21 214 lb (97.1 kg)  03/02/21 218 lb (98.9 kg)      Physical Exam Vitals and nursing note reviewed.  Constitutional:      Appearance: Normal appearance.  Cardiovascular:     Rate and Rhythm:  Normal rate and regular rhythm.     Pulses: Normal pulses.     Heart sounds: Normal heart sounds.  Pulmonary:     Effort: Pulmonary effort is normal.     Breath sounds: Normal breath sounds.  Abdominal:     General: Bowel sounds are normal.     Palpations: Abdomen is soft.     Tenderness: There is abdominal tenderness in the left lower quadrant.     Comments: LLQ pain radiating down to left groin area.  Neurological:     Mental Status: She is alert and oriented to person, place, and time.  Psychiatric:        Mood and Affect: Mood normal.        Behavior: Behavior normal.      Results for orders placed or performed in visit on 04/19/22  POCT Urinalysis Dipstick (Automated)  Result Value Ref Range   Color, UA yellow    Clarity, UA clear    Glucose, UA Negative Negative   Bilirubin, UA neg    Ketones, UA neg    Spec Grav, UA 1.025 1.010 - 1.025   Blood, UA neg    pH, UA 5.0 5.0 - 8.0   Protein, UA Negative Negative   Urobilinogen, UA negative (A) 0.2 or 1.0 E.U./dL   Nitrite, UA neg    Leukocytes, UA Negative Negative  POCT urine pregnancy  Result Value Ref Range   Preg Test, Ur Negative Negative       The ASCVD Risk score (Arnett DK, et al., 2019) failed to calculate for the following reasons:   The 2019 ASCVD risk score is only valid for ages 79 to 74    Assessment & Plan:   Problem List Items Addressed This Visit       Other   Left lower quadrant abdominal pain - Primary    Differentials include diverticulitis, pregnancy, ectopic pregnancy, urinary tract infection.   POC Urine dipstick and urine pregnancy was negative.   CBC with diff, BMP and HCG quantitative pending.   Consider CT scan Abdomen/Pelvic STAT once HCG quantitative is resulted.  She will update if symptoms worsen.      Relevant Orders   hCG, quantitative, pregnancy   CBC with Differential   Basic Metabolic Panel   POCT Urinalysis Dipstick (Automated) (Completed)   POCT urine  pregnancy (Completed)    No follow-ups on file.    Tinnie Gens, BSN-RN, DNP STUDENT

## 2022-04-21 ENCOUNTER — Ambulatory Visit (HOSPITAL_COMMUNITY)
Admission: RE | Admit: 2022-04-21 | Discharge: 2022-04-21 | Disposition: A | Discharge status: Home / Self Care | Payer: 59 | Source: Ambulatory Visit | Attending: Primary Care | Admitting: Primary Care

## 2022-04-21 ENCOUNTER — Other Ambulatory Visit: Payer: Self-pay | Admitting: Primary Care

## 2022-04-21 DIAGNOSIS — R1032 Left lower quadrant pain: Secondary | ICD-10-CM

## 2022-04-21 DIAGNOSIS — R102 Pelvic and perineal pain: Secondary | ICD-10-CM | POA: Diagnosis present

## 2022-04-21 MED ORDER — AMOXICILLIN-POT CLAVULANATE 875-125 MG PO TABS: 1.0000 | ORAL_TABLET | Freq: Two times a day (BID) | ORAL | 0 refills | Status: DC

## 2022-06-29 ENCOUNTER — Ambulatory Visit (INDEPENDENT_AMBULATORY_CARE_PROVIDER_SITE_OTHER): Payer: 59 | Admitting: Nurse Practitioner

## 2022-06-29 VITALS — BP 108/64 | HR 94 | Temp 98.8°F | Resp 16 | Ht 69.0 in | Wt 214.5 lb

## 2022-06-29 DIAGNOSIS — R197 Diarrhea, unspecified: Secondary | ICD-10-CM | POA: Insufficient documentation

## 2022-06-29 DIAGNOSIS — R0982 Postnasal drip: Secondary | ICD-10-CM | POA: Insufficient documentation

## 2022-06-29 DIAGNOSIS — J029 Acute pharyngitis, unspecified: Secondary | ICD-10-CM

## 2022-06-29 DIAGNOSIS — B349 Viral infection, unspecified: Secondary | ICD-10-CM | POA: Diagnosis not present

## 2022-06-29 DIAGNOSIS — G4489 Other headache syndrome: Secondary | ICD-10-CM

## 2022-06-29 LAB — POC COVID19 BINAXNOW: SARS Coronavirus 2 Ag: NEGATIVE

## 2022-06-29 LAB — POCT RAPID STREP A (OFFICE): Rapid Strep A Screen: NEGATIVE

## 2022-06-29 LAB — POCT INFLUENZA A/B
Influenza A, POC: NEGATIVE
Influenza B, POC: NEGATIVE

## 2022-06-29 MED ORDER — FLUTICASONE PROPIONATE 50 MCG/ACT NA SUSP: 2.0000 | Freq: Every day | NASAL | 0 refills | Status: DC

## 2022-06-29 NOTE — Assessment & Plan Note (Signed)
Strep test in office.  Use Flonase, Tylenol/ibuprofen, throat lozenges, salt water gargles for symptomatic relief.

## 2022-06-29 NOTE — Assessment & Plan Note (Signed)
Flonase nasal spray - 2 sprays each nostril once daily

## 2022-06-29 NOTE — Assessment & Plan Note (Signed)
Flu, COVID test in office.  Over-the-counter analgesics as needed

## 2022-06-29 NOTE — Patient Instructions (Signed)
Nice to see you today I have sent in some nasal spray Continue your allergy medicine You can use tylenol or ibuprofen as needed for the sore throat, body aches. You can use warm salt water gargles for the sore throat and throat lozenges. Follow up if you are not improving by the beginning of the week

## 2022-06-29 NOTE — Assessment & Plan Note (Signed)
Flu, COVID, strep test negative in office.  Symptomatic treatment.  Plenty fluids, rest.  Take is on nasal spray 2 sprays each nostril daily.  Continue over-the-counter oral antihistamine.  Patient can use over-the-counter analgesics as needed such as Tylenol or ibuprofen for sore throat, body aches, headache.  Follow-up if no improvement

## 2022-06-29 NOTE — Assessment & Plan Note (Signed)
Secondary to viral infection.

## 2022-06-29 NOTE — Progress Notes (Signed)
Acute Office Visit  Subjective:     Patient ID: Laurie Burns, female    DOB: January 22, 1996, 27 y.o.   MRN: [id number]  Chief Complaint  Patient presents with   Sore Throat    Since Sunday   Ear Fullness    Since Monday    Sore Throat  Associated symptoms include diarrhea, ear pain (full), headaches and a plugged ear sensation. Pertinent negatives include no abdominal pain, coughing, shortness of breath or vomiting.  Ear Fullness  Associated symptoms include diarrhea, headaches and a sore throat. Pertinent negatives include no abdominal pain, coughing or vomiting.   Patient is in today for sick symptoms with a history of migrinaes, GERD, obesity  Symptoms started on Sunday States that her husband had an illness this week with a cough and congested. States that he is better  Covid vaccine: two shot series Flu vaccine: not up to date Sick contacts States that she has been taking her Multivitamin, Elderberry, Allergy pill. States she did take a mucinex Dm. States that the mucinex did not help   Review of Systems  Constitutional:  Positive for malaise/fatigue. Negative for chills and fever.       Appetite is normal Fluid intake is good per her report   HENT:  Positive for ear pain (full) and sore throat. Negative for sinus pain.   Respiratory:  Negative for cough and shortness of breath.   Gastrointestinal:  Positive for diarrhea and nausea. Negative for abdominal pain and vomiting.  Musculoskeletal:  Positive for myalgias.  Neurological:  Positive for headaches.        Objective:    BP 108/64   Pulse 94   Temp 98.8 F (37.1 C)   Resp 16   Ht 5\' 9"  (1.753 m)   Wt 214 lb 8 oz (97.3 kg)   LMP 06/02/2022   SpO2 99%   BMI 31.68 kg/m    Physical Exam Vitals and nursing note reviewed.  Constitutional:      Appearance: Normal appearance.  HENT:     Right Ear: Tympanic membrane, ear canal and external ear normal.     Left Ear: Ear canal and external ear normal.      Ears:     Comments: Scarring to left tm    Nose:     Right Sinus: No maxillary sinus tenderness or frontal sinus tenderness.     Left Sinus: No maxillary sinus tenderness or frontal sinus tenderness.     Mouth/Throat:     Mouth: Mucous membranes are moist.     Pharynx: Posterior oropharyngeal erythema (left sided) present.  Cardiovascular:     Rate and Rhythm: Normal rate and regular rhythm.     Heart sounds: Normal heart sounds.  Pulmonary:     Effort: Pulmonary effort is normal.     Breath sounds: Normal breath sounds.  Abdominal:     General: Bowel sounds are normal. There is no distension.     Palpations: There is no mass.     Tenderness: There is no abdominal tenderness.     Hernia: No hernia is present.  Lymphadenopathy:     Cervical: No cervical adenopathy.  Neurological:     Mental Status: She is alert.     Results for orders placed or performed in visit on 06/29/22  POC COVID-19 BinaxNow  Result Value Ref Range   SARS Coronavirus 2 Ag Negative Negative  POCT rapid strep A  Result Value Ref Range   Rapid Strep A  Screen Negative Negative  POCT Influenza A/B  Result Value Ref Range   Influenza A, POC Negative Negative   Influenza B, POC Negative Negative        Assessment & Plan:   Problem List Items Addressed This Visit       Digestive   Diarrhea of presumed infectious origin    Secondary to viral infection.        Other   Sore throat - Primary    Strep test in office.  Use Flonase, Tylenol/ibuprofen, throat lozenges, salt water gargles for symptomatic relief.      Relevant Orders   POC COVID-19 BinaxNow (Completed)   POCT rapid strep A (Completed)   POCT Influenza A/B (Completed)   PND (post-nasal drip)    Flonase nasal spray 2 sprays each nostril once daily.      Relevant Medications   fluticasone (FLONASE) 50 MCG/ACT nasal spray   Viral illness    Flu, COVID, strep test negative in office.  Symptomatic treatment.  Plenty fluids, rest.   Take is on nasal spray 2 sprays each nostril daily.  Continue over-the-counter oral antihistamine.  Patient can use over-the-counter analgesics as needed such as Tylenol or ibuprofen for sore throat, body aches, headache.  Follow-up if no improvement      Other headache syndrome    Flu, COVID test in office.  Over-the-counter analgesics as needed       Meds ordered this encounter  Medications   fluticasone (FLONASE) 50 MCG/ACT nasal spray    Sig: Place 2 sprays into both nostrils daily.    Dispense:  16 g    Refill:  0    Order Specific Question:   Supervising Provider    Answer:   TOWER, MARNE A [[id number]]    Return if symptoms worsen or fail to improve.  Laurie Garret, NP

## 2022-07-03 ENCOUNTER — Encounter: Payer: Self-pay | Admitting: Nurse Practitioner

## 2022-07-03 ENCOUNTER — Other Ambulatory Visit: Payer: Self-pay | Admitting: Primary Care

## 2022-07-03 ENCOUNTER — Ambulatory Visit (INDEPENDENT_AMBULATORY_CARE_PROVIDER_SITE_OTHER): Payer: 59 | Admitting: Nurse Practitioner

## 2022-07-03 VITALS — BP 102/78 | HR 66 | Temp 98.1°F | Resp 16 | Ht 69.0 in | Wt 216.4 lb

## 2022-07-03 DIAGNOSIS — J40 Bronchitis, not specified as acute or chronic: Secondary | ICD-10-CM | POA: Diagnosis not present

## 2022-07-03 DIAGNOSIS — H6992 Unspecified Eustachian tube disorder, left ear: Secondary | ICD-10-CM | POA: Diagnosis not present

## 2022-07-03 DIAGNOSIS — R0602 Shortness of breath: Secondary | ICD-10-CM

## 2022-07-03 DIAGNOSIS — K219 Gastro-esophageal reflux disease without esophagitis: Secondary | ICD-10-CM

## 2022-07-03 MED ORDER — PREDNISONE 20 MG PO TABS: ORAL_TABLET | ORAL | 0 refills | Status: AC

## 2022-07-03 MED ORDER — AZITHROMYCIN 250 MG PO TABS: ORAL_TABLET | ORAL | 0 refills | Status: AC

## 2022-07-03 NOTE — Patient Instructions (Signed)
Nice to see you today I have sent in an antibiotic and steroids to the pharmacy Follow up if no improvement  

## 2022-07-03 NOTE — Assessment & Plan Note (Addendum)
At rest and with exertion.  Patient's lungs clear to auscultation in office.  Will do prednisone taper avoid NSAIDs.  Did offer EKG and further workup with palpitations.  Patient would like to hold off for now.  Likely secondary to the nighttime antihistamine medication that she took.  Heart regular to auscultation in office today.

## 2022-07-03 NOTE — Progress Notes (Addendum)
Acute Office Visit  Subjective:     Patient ID: Laurie Burns, female    DOB: June 25, 1995, 27 y.o.   MRN: [id number]  Chief Complaint  Patient presents with   Chest Pain    X last night    Ear Pain     Patient is in today for sick visit  She was seen by me on 06/29/2022. She was tested for coivd, flu and strep which all were negative Her symptoms originally started 8 days ago Covid vaccine: x2 Flu vaccine: not up to date   She was written Flonase nasal spray.  States that she had some left at home and using which she plans opening up the prescription today  States that she felt the best she had yesterday. States that last night she was experiening some chest tightness and palpitations. States that her pulse was normal per her husband. States that she has not had any more palpations   States that she used an over the counter night time antihsitamine medicine prior to the palpitations starting  Review of Systems  Constitutional:  Positive for malaise/fatigue. Negative for chills and fever.  HENT:  Positive for ear pain. Negative for ear discharge, sinus pain and sore throat.   Respiratory:  Positive for cough and shortness of breath (DOE and at rest).   Cardiovascular:  Positive for chest pain.  Gastrointestinal:  Negative for diarrhea, nausea and vomiting.  Musculoskeletal:  Positive for myalgias.  Neurological:  Negative for headaches.        Objective:    BP 102/78   Pulse 66   Temp 98.1 F (36.7 C)   Resp 16   Ht 5\' 9"  (1.753 m)   Wt 216 lb 6 oz (98.1 kg)   LMP 06/02/2022   SpO2 96%   BMI 31.95 kg/m    Physical Exam Vitals and nursing note reviewed.  Constitutional:      Appearance: Normal appearance.  HENT:     Right Ear: Tympanic membrane, ear canal and external ear normal.     Left Ear: Ear canal and external ear normal.     Nose:     Right Sinus: No maxillary sinus tenderness or frontal sinus tenderness.     Left Sinus: No maxillary sinus  tenderness or frontal sinus tenderness.     Mouth/Throat:     Mouth: Mucous membranes are moist.     Pharynx: Oropharynx is clear.  Cardiovascular:     Rate and Rhythm: Normal rate and regular rhythm.     Heart sounds: Normal heart sounds.  Pulmonary:     Effort: Pulmonary effort is normal.     Breath sounds: Normal breath sounds.  Lymphadenopathy:     Cervical: No cervical adenopathy.  Neurological:     Mental Status: She is alert.     No results found for any visits on 07/03/22.      Assessment & Plan:   Problem List Items Addressed This Visit       Respiratory   Bronchitis    Given symptoms been ongoing for a week and patient has gotten worse will like to treat with azithromycin 250 mg as directed.  Follow-up if no improvement      Relevant Medications   azithromycin (ZITHROMAX) 250 MG tablet     Nervous and Auditory   Dysfunction of left eustachian tube    Patient is using Flonase without great relief.  Will send in prednisone.  Continue Flonase  Relevant Medications   predniSONE (DELTASONE) 20 MG tablet     Other   Shortness of breath - Primary    At rest and with exertion.  Patient's lungs clear to auscultation in office.  Will do prednisone taper avoid NSAIDs.  Did offer EKG and further workup with palpitations.  Patient would like to hold off for now.  Likely secondary to the nighttime antihistamine medication that she took.  Heart regular to auscultation in office today.      Relevant Medications   predniSONE (DELTASONE) 20 MG tablet    Meds ordered this encounter  Medications   azithromycin (ZITHROMAX) 250 MG tablet    Sig: Take 2 tablets on day 1, then 1 tablet daily on days 2 through 5    Dispense:  6 tablet    Refill:  0    Order Specific Question:   Supervising Provider    Answer:   Loura Pardon A [[id number]]   predniSONE (DELTASONE) 20 MG tablet    Sig: Take 1 tablet (20 mg total) by mouth 2 (two) times daily with a meal for 3 days, THEN 1  tablet (20 mg total) daily with breakfast for 3 days. Avoid NSAIDs like Ibuprofen, aleve, naproxen, motrin, Aleve, BC/Goody powders.    Dispense:  9 tablet    Refill:  0    Order Specific Question:   Supervising Provider    Answer:   TOWER, MARNE A [[id number]]    Return if symptoms worsen or fail to improve.  Romilda Garret, NP

## 2022-07-03 NOTE — Assessment & Plan Note (Signed)
Given symptoms been ongoing for a week and patient has gotten worse will like to treat with azithromycin 250 mg as directed.  Follow-up if no improvement

## 2022-07-03 NOTE — Assessment & Plan Note (Addendum)
Patient is using Flonase without great relief.  Will send in prednisone.  Continue Flonase

## 2022-07-17 ENCOUNTER — Other Ambulatory Visit: Payer: Self-pay | Admitting: Primary Care

## 2022-07-17 DIAGNOSIS — K219 Gastro-esophageal reflux disease without esophagitis: Secondary | ICD-10-CM

## 2022-07-25 ENCOUNTER — Other Ambulatory Visit: Payer: Self-pay | Admitting: Nurse Practitioner

## 2022-07-25 DIAGNOSIS — R0982 Postnasal drip: Secondary | ICD-10-CM

## 2022-07-30 ENCOUNTER — Other Ambulatory Visit: Payer: Self-pay | Admitting: Nurse Practitioner

## 2022-07-30 DIAGNOSIS — R0982 Postnasal drip: Secondary | ICD-10-CM

## 2022-08-16 ENCOUNTER — Other Ambulatory Visit: Payer: Self-pay | Admitting: Primary Care

## 2022-08-16 DIAGNOSIS — K219 Gastro-esophageal reflux disease without esophagitis: Secondary | ICD-10-CM

## 2022-08-16 NOTE — Telephone Encounter (Signed)
From: Sabino Dick Ambler To: Office of Doreene Nest, NP Sent: 08/16/2022 9:15 AM EDT Subject: Medication Renewal Request  Refills have been requested for the following medications:   omeprazole (PRILOSEC) 20 MG capsule [[place] [facility] Rhya Shan]  Preferred pharmacy: [id number] [place], [place] - [address] Delivery method: [employer]

## 2022-09-07 ENCOUNTER — Other Ambulatory Visit: Payer: Self-pay | Admitting: Primary Care

## 2022-09-07 DIAGNOSIS — K219 Gastro-esophageal reflux disease without esophagitis: Secondary | ICD-10-CM

## 2022-09-13 LAB — OB RESULTS CONSOLE HEPATITIS B SURFACE ANTIGEN: Hepatitis B Surface Ag: NEGATIVE

## 2022-09-13 LAB — OB RESULTS CONSOLE HIV ANTIBODY (ROUTINE TESTING): HIV: NONREACTIVE

## 2022-09-13 LAB — OB RESULTS CONSOLE ABO/RH: RH Type: NEGATIVE

## 2022-09-13 LAB — OB RESULTS CONSOLE ANTIBODY SCREEN: Antibody Screen: NEGATIVE

## 2022-09-13 LAB — OB RESULTS CONSOLE RPR: RPR: NONREACTIVE

## 2022-09-13 LAB — OB RESULTS CONSOLE RUBELLA ANTIBODY, IGM: Rubella: IMMUNE

## 2022-09-13 LAB — HEPATITIS C ANTIBODY: HCV Ab: NEGATIVE

## 2022-09-22 LAB — OB RESULTS CONSOLE GC/CHLAMYDIA
Chlamydia: NEGATIVE
Neisseria Gonorrhea: NEGATIVE

## 2023-03-14 ENCOUNTER — Ambulatory Visit (INDEPENDENT_AMBULATORY_CARE_PROVIDER_SITE_OTHER): Payer: 59 | Admitting: Pediatrics

## 2023-03-14 DIAGNOSIS — Z7681 Expectant parent(s) prebirth pediatrician visit: Secondary | ICD-10-CM

## 2023-03-14 NOTE — Progress Notes (Signed)
Prenatal counseling for impending newborn done Parent agrees to vaccine and office policies [phone]

## 2023-03-29 LAB — OB RESULTS CONSOLE GBS: GBS: NEGATIVE

## 2023-04-04 NOTE — L&D Delivery Note (Addendum)
 Delivery Note At 5:20 PM a viable female was delivered via Vaginal, Spontaneous (Presentation: Right Occiput Anterior).  APGAR: 9, 9; weight pending.   Placenta status: Spontaneous, Intact.  Cord: 3 vessels with the following complications: None. Nuchal cord x1, reduced easily after delivery.  Anesthesia: Epidural Episiotomy: None Lacerations: 2nd degree Suture Repair: 2.0 vicryl rapide Est. Blood Loss (mL): 410ccs  Mom to postpartum.  Baby to Couplet care / Skin to Skin.  Grace Laura 04/18/2023, 5:42 PM

## 2023-04-16 ENCOUNTER — Encounter (HOSPITAL_COMMUNITY): Payer: Self-pay | Admitting: *Deleted

## 2023-04-16 ENCOUNTER — Encounter (HOSPITAL_COMMUNITY): Payer: Self-pay

## 2023-04-16 ENCOUNTER — Telehealth (HOSPITAL_COMMUNITY): Payer: Self-pay | Admitting: *Deleted

## 2023-04-16 NOTE — Telephone Encounter (Signed)
 Preadmission screen

## 2023-04-17 NOTE — H&P (Signed)
 Preadmission IOL History and Physical  Laurie Burns is a 28 y.o. female presenting for IOL for GDMA1, well-controlled. Pregnancy has otherwise been uncomplicated. 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  OB History     Gravida  1   Para      Term      Preterm      AB      Living         SAB      IAB      Ectopic      Multiple      Live Births             Past Medical History:  Diagnosis Date   GERD (gastroesophageal reflux disease)    Gestational diabetes    Past Surgical History:  Procedure Laterality Date   BUNIONECTOMY Left 04/29/2020   Procedure: Left First metatarsal scarf osteotomy, akin osteotomy, modified McBride and cheilectomy;  Surgeon: Kit Rush, MD;  Location: [facility];  Service: Orthopedics;  Laterality: Left;   Family History: family history includes Cancer in her maternal grandmother, mother, paternal grandfather, and paternal grandmother; Hypertension in her father. Social History:  reports that she has never smoked. She has been exposed to tobacco smoke. She has never used smokeless tobacco. She reports that she does not drink alcohol and does not use drugs.     Maternal Diabetes: Yes:  Diabetes Type:  Diet controlled Genetic Screening: Normal Maternal Ultrasounds/Referrals: Normal Fetal Ultrasounds or other Referrals:  None Maternal Substance Abuse:  No Significant Maternal Medications:  None Significant Maternal Lab Results:  Group B Strep negative Number of Prenatal Visits:greater than 3 verified prenatal visits Maternal Vaccinations: RSV, flu, TDAP, COVID Other Comments:  None   There were no vitals filed for this visit. Vitals reviewed from office visits Last menstrual period 06/02/2022.  Exam from prior office visits Constitutional:      Appearance: Normal appearance.  HENT:     Head: Normocephalic.  Eyes:     Pupils: Pupils  are equal, round Cardiovascular:     Rate and Rhythm: Normal rate    Pulses: Normal pulses.  Abdominal:     General: Abdomen is Gravid, nontender Neurological:     Mental Status: She is alert.   Prenatal labs: ABO, Rh: B/Negative/-- (06/12 0000) Antibody: Negative (06/12 0000) Rubella: Immune (06/12 0000) RPR: Nonreactive (06/12 0000)  HBsAg: Negative (06/12 0000)  HIV: Non-reactive (06/12 0000)  GBS: Negative/-- (12/26 0000)   Assessment/Plan: 28 yo G1P0 at [redacted]w[redacted]d presenting for IOL s/s GDMA1. Cytotec  IOL - plan for Pitocin /AROM when able. BG well controlled with diet - will check q4. GBS neg   Slater JINNY Door 04/17/2023, 11:55 AM

## 2023-04-18 ENCOUNTER — Encounter (HOSPITAL_COMMUNITY): Payer: Self-pay | Admitting: Obstetrics and Gynecology

## 2023-04-18 ENCOUNTER — Other Ambulatory Visit: Payer: Self-pay

## 2023-04-18 ENCOUNTER — Inpatient Hospital Stay (HOSPITAL_COMMUNITY): Payer: 59

## 2023-04-18 ENCOUNTER — Inpatient Hospital Stay (HOSPITAL_COMMUNITY): Payer: 59 | Admitting: Anesthesiology

## 2023-04-18 ENCOUNTER — Inpatient Hospital Stay (HOSPITAL_COMMUNITY)
Admission: RE | Admit: 2023-04-18 | Discharge: 2023-04-20 | DRG: 807 | Disposition: A | Discharge status: Home / Self Care | Payer: 59 | Attending: Obstetrics and Gynecology | Admitting: Obstetrics and Gynecology

## 2023-04-18 DIAGNOSIS — Z3A39 39 weeks gestation of pregnancy: Secondary | ICD-10-CM

## 2023-04-18 DIAGNOSIS — Z8249 Family history of ischemic heart disease and other diseases of the circulatory system: Secondary | ICD-10-CM

## 2023-04-18 DIAGNOSIS — O9962 Diseases of the digestive system complicating childbirth: Secondary | ICD-10-CM | POA: Diagnosis present

## 2023-04-18 DIAGNOSIS — Z141 Cystic fibrosis carrier: Secondary | ICD-10-CM | POA: Diagnosis not present

## 2023-04-18 DIAGNOSIS — K219 Gastro-esophageal reflux disease without esophagitis: Secondary | ICD-10-CM | POA: Diagnosis present

## 2023-04-18 DIAGNOSIS — O24420 Gestational diabetes mellitus in childbirth, diet controlled: Principal | ICD-10-CM | POA: Diagnosis present

## 2023-04-18 DIAGNOSIS — O24419 Gestational diabetes mellitus in pregnancy, unspecified control: Principal | ICD-10-CM | POA: Diagnosis present

## 2023-04-18 LAB — CBC
HCT: 33.1 % — ABNORMAL LOW (ref 36.0–46.0)
Hemoglobin: 10.7 g/dL — ABNORMAL LOW (ref 12.0–15.0)
MCH: 27.4 pg (ref 26.0–34.0)
MCHC: 32.3 g/dL (ref 30.0–36.0)
MCV: 84.9 fL (ref 80.0–100.0)
Platelets: 168 10*3/uL (ref 150–400)
RBC: 3.9 MIL/uL (ref 3.87–5.11)
RDW: 14.3 % (ref 11.5–15.5)
WBC: 9.6 10*3/uL (ref 4.0–10.5)
nRBC: 0 % (ref 0.0–0.2)

## 2023-04-18 LAB — GLUCOSE, CAPILLARY
Glucose-Capillary: 98 mg/dL (ref 70–99)
Glucose-Capillary: 79 mg/dL (ref 70–99)
Glucose-Capillary: 94 mg/dL (ref 70–99)
Glucose-Capillary: 67 mg/dL — ABNORMAL LOW (ref 70–99)
Glucose-Capillary: 88 mg/dL (ref 70–99)
Glucose-Capillary: 85 mg/dL (ref 70–99)

## 2023-04-18 LAB — TYPE AND SCREEN
ABO/RH(D): B NEG
Antibody Screen: POSITIVE

## 2023-04-18 LAB — RPR: RPR Ser Ql: NONREACTIVE

## 2023-04-18 MED ORDER — BUPIVACAINE HCL (PF) 0.25 % IJ SOLN
INTRAMUSCULAR | Status: DC | PRN
  Administered 2023-04-18 (×2): 5 mL via EPIDURAL

## 2023-04-18 MED ORDER — LACTATED RINGERS IV SOLN: 500.0000 mL | Freq: Once | INTRAVENOUS | Status: DC

## 2023-04-18 MED ORDER — PHENYLEPHRINE 80 MCG/ML (10ML) SYRINGE FOR IV PUSH (FOR BLOOD PRESSURE SUPPORT): 80.0000 ug | PREFILLED_SYRINGE | INTRAVENOUS | Status: DC | PRN

## 2023-04-18 MED ORDER — LIDOCAINE-EPINEPHRINE (PF) 2 %-1:200000 IJ SOLN
INTRAMUSCULAR | Status: DC | PRN
  Administered 2023-04-18: 5 mL via EPIDURAL

## 2023-04-18 MED ORDER — TERBUTALINE SULFATE 1 MG/ML IJ SOLN: 0.2500 mg | Freq: Once | INTRAMUSCULAR | Status: DC | PRN

## 2023-04-18 MED ORDER — DIPHENHYDRAMINE HCL 25 MG PO CAPS: 25.0000 mg | ORAL_CAPSULE | Freq: Four times a day (QID) | ORAL | Status: DC | PRN

## 2023-04-18 MED ORDER — ACETAMINOPHEN 325 MG PO TABS: 650.0000 mg | ORAL_TABLET | ORAL | Status: DC | PRN

## 2023-04-18 MED ORDER — HYDROXYZINE HCL 50 MG PO TABS: 50.0000 mg | ORAL_TABLET | Freq: Four times a day (QID) | ORAL | Status: DC | PRN

## 2023-04-18 MED ORDER — LACTATED RINGERS IV SOLN: INTRAVENOUS | Status: DC

## 2023-04-18 MED ORDER — MISOPROSTOL 50MCG HALF TABLET
50.0000 ug | ORAL_TABLET | ORAL | Status: DC
  Administered 2023-04-18 (×2): 50 ug via BUCCAL
  Filled 2023-04-18 (×11): qty 1

## 2023-04-18 MED ORDER — EPHEDRINE 5 MG/ML INJ: 10.0000 mg | INTRAVENOUS | Status: DC | PRN

## 2023-04-18 MED ORDER — OXYTOCIN BOLUS FROM INFUSION
333.0000 mL | Freq: Once | INTRAVENOUS | Status: AC
  Administered 2023-04-18: 333 mL via INTRAVENOUS

## 2023-04-18 MED ORDER — COCONUT OIL OIL: 1.0000 | TOPICAL_OIL | Status: DC | PRN

## 2023-04-18 MED ORDER — ONDANSETRON HCL 4 MG/2ML IJ SOLN: 4.0000 mg | INTRAMUSCULAR | Status: DC | PRN

## 2023-04-18 MED ORDER — DIBUCAINE (PERIANAL) 1 % EX OINT: 1.0000 | TOPICAL_OINTMENT | CUTANEOUS | Status: DC | PRN

## 2023-04-18 MED ORDER — SOD CITRATE-CITRIC ACID 500-334 MG/5ML PO SOLN: 30.0000 mL | ORAL | Status: DC | PRN

## 2023-04-18 MED ORDER — OXYTOCIN-SODIUM CHLORIDE 30-0.9 UT/500ML-% IV SOLN
1.0000 m[IU]/min | INTRAVENOUS | Status: DC
Start: 2023-04-18 — End: 2023-04-18
  Administered 2023-04-18: 2 m[IU]/min via INTRAVENOUS
  Filled 2023-04-18: qty 500

## 2023-04-18 MED ORDER — DIPHENHYDRAMINE HCL 50 MG/ML IJ SOLN: 12.5000 mg | INTRAMUSCULAR | Status: DC | PRN

## 2023-04-18 MED ORDER — LACTATED RINGERS IV SOLN: 500.0000 mL | INTRAVENOUS | Status: DC | PRN

## 2023-04-18 MED ORDER — ONDANSETRON HCL 4 MG/2ML IJ SOLN: 4.0000 mg | Freq: Four times a day (QID) | INTRAMUSCULAR | Status: DC | PRN

## 2023-04-18 MED ORDER — FENTANYL-BUPIVACAINE-NACL 0.5-0.125-0.9 MG/250ML-% EP SOLN
12.0000 mL/h | EPIDURAL | Status: DC | PRN
  Administered 2023-04-18: 12 mL/h via EPIDURAL
  Filled 2023-04-18: qty 250

## 2023-04-18 MED ORDER — ACETAMINOPHEN 325 MG PO TABS
650.0000 mg | ORAL_TABLET | ORAL | Status: DC | PRN
  Administered 2023-04-19 (×2): 650 mg via ORAL
  Filled 2023-04-18 (×2): qty 2

## 2023-04-18 MED ORDER — ZOLPIDEM TARTRATE 5 MG PO TABS: 5.0000 mg | ORAL_TABLET | Freq: Every evening | ORAL | Status: DC | PRN

## 2023-04-18 MED ORDER — OXYTOCIN-SODIUM CHLORIDE 30-0.9 UT/500ML-% IV SOLN: 2.5000 [IU]/h | INTRAVENOUS | Status: DC

## 2023-04-18 MED ORDER — PRENATAL MULTIVITAMIN CH
1.0000 | ORAL_TABLET | Freq: Every day | ORAL | Status: DC
  Administered 2023-04-19: 1 via ORAL
  Filled 2023-04-18: qty 1

## 2023-04-18 MED ORDER — LIDOCAINE HCL (PF) 1 % IJ SOLN: 30.0000 mL | INTRAMUSCULAR | Status: DC | PRN

## 2023-04-18 MED ORDER — OXYCODONE HCL 5 MG PO TABS: 5.0000 mg | ORAL_TABLET | ORAL | Status: DC | PRN

## 2023-04-18 MED ORDER — IBUPROFEN 600 MG PO TABS
600.0000 mg | ORAL_TABLET | Freq: Four times a day (QID) | ORAL | Status: DC
  Administered 2023-04-19 (×5): 600 mg via ORAL
  Filled 2023-04-18 (×6): qty 1

## 2023-04-18 MED ORDER — SIMETHICONE 80 MG PO CHEW: 80.0000 mg | CHEWABLE_TABLET | ORAL | Status: DC | PRN

## 2023-04-18 MED ORDER — FENTANYL CITRATE (PF) 100 MCG/2ML IJ SOLN
50.0000 ug | INTRAMUSCULAR | Status: DC | PRN
  Filled 2023-04-18: qty 2

## 2023-04-18 MED ORDER — TETANUS-DIPHTH-ACELL PERTUSSIS 5-2.5-18.5 LF-MCG/0.5 IM SUSY: 0.5000 mL | PREFILLED_SYRINGE | Freq: Once | INTRAMUSCULAR | Status: DC

## 2023-04-18 MED ORDER — BENZOCAINE-MENTHOL 20-0.5 % EX AERO
1.0000 | INHALATION_SPRAY | CUTANEOUS | Status: DC | PRN
  Administered 2023-04-18 – 2023-04-19 (×2): 1 via TOPICAL
  Filled 2023-04-18 (×2): qty 56

## 2023-04-18 MED ORDER — ONDANSETRON HCL 4 MG PO TABS: 4.0000 mg | ORAL_TABLET | ORAL | Status: DC | PRN

## 2023-04-18 MED ORDER — SENNOSIDES-DOCUSATE SODIUM 8.6-50 MG PO TABS
2.0000 | ORAL_TABLET | Freq: Every day | ORAL | Status: DC
  Administered 2023-04-19 – 2023-04-20 (×2): 2 via ORAL
  Filled 2023-04-18 (×2): qty 2

## 2023-04-18 MED ORDER — WITCH HAZEL-GLYCERIN EX PADS
1.0000 | MEDICATED_PAD | CUTANEOUS | Status: DC | PRN
Start: 2023-04-18 — End: 2023-04-20
  Administered 2023-04-18: 1 via TOPICAL

## 2023-04-18 NOTE — Progress Notes (Signed)
 Labor Progress Note  Cervix 1.5cm - reviewed option to place foley balloon. Patient in agreement. FB 60ccs placed. Will do pitocin  up to 4 with FB in place.  Cathryn Cobb, MD

## 2023-04-18 NOTE — Lactation Note (Signed)
 This note was copied from a baby's chart. Lactation Consultation Note  Patient Name: Laurie Burns [id number] Date: 04/18/2023 Age:29 hours Reason for consult: Initial assessment;Primapara;Term;Maternal endocrine disorder Baby unable to latch. Mom has edema to breast/nipple sinks inward when breast compressed. Mom stated she received a lot of fluids during labor. Shells given to mom to wear in am w/bra. Hand pump to pre-pump but wasn't helpful. LC ended up using NS. Reverse pressure not helpful enough. Baby finally took well to NS and was BF well when LC left. Discussed mom pumping and giving back anything she collects d/t GDM. Mom in agreement. Mom shown how to use DEBP & how to disassemble, clean, & reassemble parts. Suggested pump every 3 hrs.  Mom encouraged to feed baby 8-12 times/24 hours and with feeding cues.  Mom is cramping from BF and very sleepy. Would like to rest. Newborn feeding habits, STS, I&O positioning, body alignment and support reviewed. Encouraged mom to call for assistance as needed. Plan: Wear shells during the day in bra until nipple everting well and edema lessened. Using NS to BF until nipple evert or breast is more compressible for latching. Pump for extra stimulation and supplement baby w/colostrum.  Maternal Data Has patient been taught Hand Expression?: Yes Does the patient have breastfeeding experience prior to this delivery?: No  Feeding Mother's Current Feeding Choice: Breast Milk and Formula  LATCH Score Latch: Grasps breast easily, tongue down, lips flanged, rhythmical sucking.  Audible Swallowing: None  Type of Nipple: Flat  Comfort (Breast/Nipple): Filling, red/small blisters or bruises, mild/mod discomfort (edema)  Hold (Positioning): Assistance needed to correctly position infant at breast and maintain latch.  LATCH Score: 5   Lactation Tools Discussed/Used Tools: Shells;Pump;Flanges;Nipple Shields Nipple shield size: 20 Flange  Size: 18 Breast pump type: Double-Electric Breast Pump Pump Education: Setup, frequency, and cleaning;Milk Storage Reason for Pumping: NS/flat Pumping frequency: q 3hr  Interventions Interventions: Breast feeding basics reviewed;Assisted with latch;Skin to skin;Breast massage;Hand express;Reverse pressure;Pre-pump if needed;Breast compression;Adjust position;Support pillows;Position options;Shells;DEBP;Hand pump;Education;LC Services brochure  Discharge    Consult Status Consult Status: Follow-up Date: 04/19/23 Follow-up type: In-patient    Laurie Burns G 04/18/2023, 10:50 PM

## 2023-04-18 NOTE — Progress Notes (Signed)
 Labor Progress Note  Feeling strong contractions FB out, cervix now 5/70/-2 AROM discussed with patient, performed with clear fluid. FHT cat 1. Continue pitocin  augmentation.  Cathryn Cobb, MD

## 2023-04-18 NOTE — Anesthesia Preprocedure Evaluation (Signed)
 Anesthesia Evaluation  Patient identified by MRN, date of birth, ID band Patient awake    Reviewed: Allergy & Precautions, NPO status , Patient's Chart, lab work & pertinent test results, reviewed documented beta blocker date and time   Airway Mallampati: II  TM Distance: >3 FB     Dental no notable dental hx.    Pulmonary shortness of breath   breath sounds clear to auscultation       Cardiovascular  Rhythm:Regular Rate:Normal     Neuro/Psych  Headaches PSYCHIATRIC DISORDERS Anxiety Depression       GI/Hepatic ,GERD  Controlled and Medicated,,  Endo/Other  diabetes, Gestational    Renal/GU      Musculoskeletal   Abdominal   Peds  Hematology   Anesthesia Other Findings   Reproductive/Obstetrics (+) Pregnancy                             Anesthesia Physical Anesthesia Plan  ASA: 2  Anesthesia Plan: Epidural   Post-op Pain Management:    Induction:   PONV Risk Score and Plan: 2 and Ondansetron   Airway Management Planned:   Additional Equipment:   Intra-op Plan:   Post-operative Plan:   Informed Consent: I have reviewed the patients History and Physical, chart, labs and discussed the procedure including the risks, benefits and alternatives for the proposed anesthesia with the patient or authorized representative who has indicated his/her understanding and acceptance.       Plan Discussed with:   Anesthesia Plan Comments:        Anesthesia Quick Evaluation

## 2023-04-18 NOTE — Progress Notes (Addendum)
 Labor Progress Note  Feeling significant cramping S/p 2 doses of cytotec  overnight - due for recheck around 0930 FHT cat 1 We reviewed AROM/pitocin  when able. All questions answered. BG have been wnl.  Cathryn Cobb, MD

## 2023-04-18 NOTE — Anesthesia Procedure Notes (Signed)
 Epidural Patient location during procedure: OB Start time: 04/18/2023 12:14 PM End time: 04/18/2023 12:28 PM  Staffing Anesthesiologist: Leslye Rast, MD Performed: anesthesiologist   Preanesthetic Checklist Completed: patient identified, IV checked, site marked, risks and benefits discussed, surgical consent, monitors and equipment checked, pre-op evaluation and timeout performed  Epidural Patient position: sitting Prep: DuraPrep Patient monitoring: heart rate, continuous pulse ox and blood pressure Approach: midline Location: L4-L5 Injection technique: LOR saline  Needle:  Needle type: Tuohy  Needle gauge: 17 G Needle length: 9 cm Needle insertion depth: 7 cm Catheter type: closed end flexible Catheter size: 19 Gauge Catheter at skin depth: 12 cm Test dose: negative and 1.5% lidocaine  with Epi 1:200 K  Assessment Events: blood not aspirated, no cerebrospinal fluid, injection not painful, no injection resistance, no paresthesia and negative IV test  Additional Notes Reason for block:procedure for pain

## 2023-04-19 LAB — CBC
HCT: 30 % — ABNORMAL LOW (ref 36.0–46.0)
Hemoglobin: 9.8 g/dL — ABNORMAL LOW (ref 12.0–15.0)
MCH: 27.7 pg (ref 26.0–34.0)
MCHC: 32.7 g/dL (ref 30.0–36.0)
MCV: 84.7 fL (ref 80.0–100.0)
Platelets: 149 10*3/uL — ABNORMAL LOW (ref 150–400)
RBC: 3.54 MIL/uL — ABNORMAL LOW (ref 3.87–5.11)
RDW: 14.4 % (ref 11.5–15.5)
WBC: 11.5 10*3/uL — ABNORMAL HIGH (ref 4.0–10.5)
nRBC: 0 % (ref 0.0–0.2)

## 2023-04-19 NOTE — Anesthesia Postprocedure Evaluation (Signed)
Anesthesia Post Note  Patient: Laurie Burns  Procedure(s) Performed: AN AD HOC LABOR EPIDURAL     Patient location during evaluation: Mother Baby Anesthesia Type: Epidural Level of consciousness: awake and alert Pain management: pain level controlled Vital Signs Assessment: post-procedure vital signs reviewed and stable Respiratory status: spontaneous breathing, nonlabored ventilation and respiratory function stable Cardiovascular status: stable Postop Assessment: no headache, no backache and epidural receding Anesthetic complications: no   No notable events documented.  Last Vitals:  Vitals:   04/19/23 0036 04/19/23 0538  BP: 105/69 110/78  Pulse: 96 79  Resp: 18 18  Temp: 36.7 C 36.6 C  SpO2: 98% 98%    Last Pain:  Vitals:   04/19/23 0538  TempSrc: Oral  PainSc: 3    Pain Goal:                   [name]

## 2023-04-19 NOTE — Progress Notes (Signed)
Post Partum Day 1 Subjective: no complaints, up ad lib, voiding, and tolerating PO  Objective: Blood pressure 110/78, pulse 79, temperature 97.8 F (36.6 C), temperature source Oral, resp. rate 18, height 5\' 9"  (1.753 m), weight 102.8 kg, last menstrual period 06/02/2022, SpO2 98%, unknown if currently breastfeeding.  Physical Exam:  General: alert Lochia: appropriate Uterine Fundus: firm Incision: N/A DVT Evaluation: No evidence of DVT seen on physical exam.  Recent Labs    04/18/23 0034 04/19/23 0537  HGB 10.7* 9.8*  HCT 33.1* 30.0*    Assessment/Plan: Plan for discharge tomorrow and Breastfeeding. Meeting all goals. If baby is able to be discharged at 24 hour mark, may desire to go home today.     LOS: 1 day   Ranae Pila, MD 04/19/2023, 8:47 AM

## 2023-04-19 NOTE — Social Work (Signed)
MOB was referred for history of depression/anxiety.  * Referral screened out by Clinical Social Worker because none of the following criteria appear to apply:  ~ History of anxiety/depression during this pregnancy, or of post-partum depression following prior delivery.  ~ Diagnosis of anxiety and/or depression within last 3 years OR * MOB's symptoms currently being treated with medication and/or therapy.  Per chart review MOB diagnosis dates back to 2019, per OB records no MH  concerns noted during this pregnancy.  Please contact the Clinical Social Worker if needs arise, by [facility] request, or if MOB scores greater than 9/yes to question 10 on Edinburgh Postpartum Depression Screen.  Wende Neighbors, LCSWA Clinical Social Worker [phone]

## 2023-04-20 MED ORDER — IBUPROFEN 600 MG PO TABS: 600.0000 mg | ORAL_TABLET | Freq: Four times a day (QID) | ORAL | 0 refills | Status: DC | PRN

## 2023-04-20 NOTE — Lactation Note (Signed)
This note was copied from a baby's chart. Lactation Consultation Note  Patient Name: Laurie Burns [id number] Date: 04/20/2023 Age:29 hours Reason for consult: Follow-up assessment Mom and dad getting ready to be D/C.  Per mom has been using the #20 NS and baby latches but doesn't stay interested long.  And tried without the NS and the baby stayed latched and did well.  LC recommended to simplified the LC plan - feed the baby on the 1st breast 15-20 mins and then supplement  Working on increasing the volume per feeding.  Post pump both breast for 15 mins , save the milk for the next feeding.  LC reassured mom she can work on weaning the baby by trying to latch or give and appetizer 1st of EBM or formula 1st and then  Latch.  Post pump is required with use of the NS .  LC reviewed BF D/C teaching, prevention and tx of engorgement.  LC offered LC O/P and mom receptive and aware she will receive a call.     Maternal Data Has patient been taught Hand Expression?: Yes Does the patient have breastfeeding experience prior to this delivery?: No  Feeding Mother's Current Feeding Choice: Breast Milk and Formula Nipple Type: Slow - flow  LATCH Score   Lactation Tools Discussed/Used Tools: Pump;Shells;Flanges Nipple shield size: 20 Flange Size: 18 Breast pump type: Double-Electric Breast Pump;Manual Pump Education: Setup, frequency, and cleaning;Milk Storage (previous LC set up) Reason for Pumping: DL  Interventions Interventions: Breast feeding basics reviewed  Discharge Discharge Education: Engorgement and breast care;Warning signs for feeding baby;Outpatient recommendation;Outpatient Epic message sent Pump: DEBP;Personal;Manual  Consult Status Consult Status: Complete Date: 04/20/23    Kathrin Greathouse 04/20/2023, 10:31 AM

## 2023-04-20 NOTE — Lactation Note (Signed)
This note was copied from a baby's chart. Lactation Consultation Note  Patient Name: Laurie Burns LOVFI'E Date: 04/20/2023 Age:29 hours Reason for consult: Follow-up assessment;Primapara;Term;Maternal endocrine disorder Mom awake, waved at [facility]. FOB awake sitting upright beside baby. Mom stated she is doing both. She has been in a lot of pain recently cramping and when she BF the cramping is right away. Mom stated her hormones is up right now and she is very tired she she is formula feeding too. Mom stated she did latch w/o NS. Asked mom to call for latch assistance for next feeding. Mom stated she probably will latch later not tonight. Informed mom Lactation will come see her today. Mom stated OK. Mom stated she just needs to get rest and get the pain under more control. Explained why when she BF her cramps are worse. Mom is taking her pain medication as ordered.  Maternal Data    Feeding Mother's Current Feeding Choice: Breast Milk and Formula Nipple Type: Slow - flow  LATCH Score                    Lactation Tools Discussed/Used    Interventions    Discharge    Consult Status Consult Status: Follow-up Date: 04/20/23 Follow-up type: In-patient    Mishelle Hassan, Diamond Nickel 04/20/2023, 3:09 AM

## 2023-04-20 NOTE — Discharge Summary (Signed)
Postpartum Discharge Summary       Patient Name: Laurie Burns DOB: Sep 23, 1995 MRN: [id number]  Date of admission: 04/18/2023 Delivery date:04/18/2023 Delivering provider: Tawni Levy Date of discharge: 04/20/2023  Admitting diagnosis: Gestational diabetes mellitus (GDM) [O24.419] Intrauterine pregnancy: [redacted]w[redacted]d     Secondary diagnosis:  Principal Problem:   Gestational diabetes mellitus (GDM)  Additional problems:      Discharge diagnosis: Term Pregnancy Delivered and GDM A1                                              Post partum procedures:   Augmentation: AROM, Pitocin, and Cytotec Complications: None  Hospital course: Induction of Labor With Vaginal Delivery   28 y.o. yo G1P1001 at [redacted]w[redacted]d was admitted to the hospital 04/18/2023 for induction of labor.  Indication for induction: A1 DM.  Patient had an labor course uncomplicated Membrane Rupture Time/Date: 12:05 PM,04/18/2023  Delivery Method:Vaginal, Spontaneous Operative Delivery:N/A Episiotomy: None Lacerations:  2nd degree Details of delivery can be found in separate delivery note.  Patient had a postpartum course uncomplicated. Patient is discharged home 04/20/23.  Newborn Data: Birth date:04/18/2023 Birth time:5:20 PM Gender:Female Living status:Living Apgars:9 ,9  Weight:3200 g  Magnesium Sulfate received: No BMZ received: No Rhophylac:N/A MMR:N/A T-DaP:Given prenatally Flu: N/A RSV Vaccine received: Yes Transfusion:No Immunizations administered: Immunization History  Administered Date(s) Administered   DTaP 01/03/1995, 02/26/1995, 05/09/1995, 12/18/1995, 12/14/1998   HIB (PRP-OMP) 01/03/1995, 02/26/1995, 05/09/1995, 12/18/1995   Hepatitis A 12/05/2006, 01/14/2008   Hepatitis B May 09, 1994, 11/28/1994, 05/09/1995   Hpv-Unspecified 01/14/2008, 04/21/2008, 08/12/2009   IPV 01/03/1995, 02/26/1995, 05/09/1995, 12/14/1998   Influenza,inj,Quad PF,6+ Mos 12/17/2017, 01/25/2021   Influenza-Unspecified  02/10/2019, 12/29/2019   MMR 10/31/1995, 12/14/1998   Meningococcal Conjugate 12/05/2006   Meningococcal Mcv4o 06/14/2012   PFIZER(Purple Top)SARS-COV-2 Vaccination 04/04/2019, 05/05/2019   Tdap 01/25/2021   Varicella 10/31/1995, 12/05/2006    Physical exam  Vitals:   04/19/23 1640 04/19/23 2108 04/20/23 0115 04/20/23 0555  BP: 104/68 102/63 113/67 111/71  Pulse: 71 74 73 78  Resp: 17 19 18 18   Temp: 97.9 F (36.6 C) 98 F (36.7 C)  98 F (36.7 C)  TempSrc: Oral Oral  Oral  SpO2: 100% 100% 100% 100%  Weight:      Height:       General: alert, cooperative, and no distress Lochia: appropriate Uterine Fundus: firm Incision: N/A DVT Evaluation: No evidence of DVT seen on physical exam. Labs: Lab Results  Component Value Date   WBC 11.5 (H) 04/19/2023   HGB 9.8 (L) 04/19/2023   HCT 30.0 (L) 04/19/2023   MCV 84.7 04/19/2023   PLT 149 (L) 04/19/2023      Latest Ref Rng & Units 04/19/2022    9:17 AM  CMP  Glucose 70 - 99 mg/dL 90   BUN 6 - 23 mg/dL 10   Creatinine 8.29 - 1.20 mg/dL 5.62   Sodium 130 - 865 mEq/L 138   Potassium 3.5 - 5.1 mEq/L 4.8   Chloride 96 - 112 mEq/L 104   CO2 19 - 32 mEq/L 26   Calcium 8.4 - 10.5 mg/dL 9.2    Edinburgh Score:    04/20/2023    9:01 AM  Edinburgh Postnatal Depression Scale Screening Tool  I have been able to laugh and see the funny side of things. 0  I have looked  forward with enjoyment to things. 0  I have blamed myself unnecessarily when things went wrong. 2  I have been anxious or worried for no good reason. 1  I have felt scared or panicky for no good reason. 1  Things have been getting on top of me. 1  I have been so unhappy that I have had difficulty sleeping. 0  I have felt sad or miserable. 0  I have been so unhappy that I have been crying. 0  The thought of harming myself has occurred to me. 0  Edinburgh Postnatal Depression Scale Total 5      After visit meds:  Allergies as of 04/20/2023   No Known  Allergies      Medication List     TAKE these medications    fluticasone 50 MCG/ACT nasal spray Commonly known as: FLONASE Place 2 sprays into both nostrils daily.   ibuprofen 600 MG tablet Commonly known as: ADVIL Take 1 tablet (600 mg total) by mouth every 6 (six) hours as needed.   omeprazole 20 MG capsule Commonly known as: PRILOSEC TAKE 1 CAPSULE BY MOUTH DAILY AS NEEDED FOR HEARTBURN         Discharge home in stable condition Infant Feeding: Breast Infant Disposition:home with mother Discharge instruction: per After Visit Summary and Postpartum booklet. Activity: Advance as tolerated. Pelvic rest for 6 weeks.  Diet: carb modified diet Anticipated Birth Control: Unsure Postpartum Appointment:6 weeks Additional Postpartum F/U:    Future Appointments:No future appointments. Follow up Visit:      04/20/2023 Turner Daniels, MD

## 2023-04-30 ENCOUNTER — Telehealth (HOSPITAL_COMMUNITY): Payer: Self-pay | Admitting: *Deleted

## 2023-04-30 NOTE — Telephone Encounter (Signed)
04/30/2023  Name: Sabino Dick Eggebrecht MRN: [id number] DOB: [birth year]  Reason for Call:  Transition of Care Hospital Discharge Call  Contact Status: Patient Contact Status: Message  Language assistant needed:          Follow-Up Questions:    Inocente Salles Postnatal Depression Scale:  In the Past 7 Days:    PHQ2-9 Depression Scale:     Discharge Follow-up:    Post-discharge interventions: NA  Salena Saner, RN 04/30/2023 16:55

## 2023-08-14 DIAGNOSIS — H5213 Myopia, bilateral: Secondary | ICD-10-CM | POA: Diagnosis not present

## 2023-09-03 DIAGNOSIS — M9907 Segmental and somatic dysfunction of upper extremity: Secondary | ICD-10-CM | POA: Diagnosis not present

## 2023-09-03 DIAGNOSIS — M9902 Segmental and somatic dysfunction of thoracic region: Secondary | ICD-10-CM | POA: Diagnosis not present

## 2023-09-03 DIAGNOSIS — M9901 Segmental and somatic dysfunction of cervical region: Secondary | ICD-10-CM | POA: Diagnosis not present

## 2023-09-03 DIAGNOSIS — M9903 Segmental and somatic dysfunction of lumbar region: Secondary | ICD-10-CM | POA: Diagnosis not present

## 2023-10-01 ENCOUNTER — Encounter: Payer: Self-pay | Admitting: Family Medicine

## 2023-10-01 ENCOUNTER — Ambulatory Visit (INDEPENDENT_AMBULATORY_CARE_PROVIDER_SITE_OTHER): Admitting: Family Medicine

## 2023-10-01 ENCOUNTER — Ambulatory Visit: Payer: Self-pay

## 2023-10-01 VITALS — BP 134/78 | HR 79 | Temp 98.5°F | Ht 69.0 in | Wt 198.2 lb

## 2023-10-01 DIAGNOSIS — H6690 Otitis media, unspecified, unspecified ear: Secondary | ICD-10-CM

## 2023-10-01 DIAGNOSIS — R0982 Postnasal drip: Secondary | ICD-10-CM

## 2023-10-01 MED ORDER — FLUTICASONE PROPIONATE 50 MCG/ACT NA SUSP
2.0000 | Freq: Every day | NASAL | Status: AC
Start: 2023-10-01 — End: ?

## 2023-10-01 MED ORDER — AMOXICILLIN-POT CLAVULANATE 875-125 MG PO TABS
1.0000 | ORAL_TABLET | Freq: Two times a day (BID) | ORAL | 0 refills | Status: AC
Start: 2023-10-01 — End: ?

## 2023-10-01 NOTE — Telephone Encounter (Signed)
 See OV  note

## 2023-10-01 NOTE — Telephone Encounter (Signed)
 Noted

## 2023-10-01 NOTE — Progress Notes (Unsigned)
 Delivered 04/18/23.  Daughter is doing well except for croup recently.    L ear, started this past night.  Woke up with pain, L jaw pain.  No trauma, no trigger.  No R sided sx.  Hearing is muffled on the L side, with whooshing sound on the L.  No FNAVD.  Some rhinorrhea, recently noted.  No cough.  No ST.   Meds, vitals, and allergies reviewed.   ROS: Per HPI unless specifically indicated in ROS section   Scant fluid (clear) R TM L TM bulging and red.

## 2023-10-01 NOTE — Patient Instructions (Signed)
 Start augmentin , restart flonase . Ibuprofen  if needed.  Take care.  Glad to see you.

## 2023-10-01 NOTE — Telephone Encounter (Signed)
 FYI Only or Action Required?: FYI only for provider.  Patient was last seen in primary care on 03/14/2023 by Donnamae Sheffield BRAVO, NP. Called Nurse Triage reporting Otalgia - left ear. Symptoms began today. Interventions attempted: Tylenol . Symptoms are: rapidly worsening.  Triage Disposition: See HCP Within 4 Hours (Or PCP Triage)  Patient/caregiver understands and will follow disposition?: Yes                 Copied from CRM [id number]. Topic: Clinical - Red Word Triage >> Oct 01, 2023  8:03 AM Kevelyn M wrote: Red Word that prompted transfer to Nurse Triage: Woke up in the middle of the night with excruciating pain and ears are clogged. Answer Assessment - Initial Assessment Questions 1. LOCATION: Which ear is involved?     Left ear 2. ONSET: When did the ear start hurting      overnight 3. SEVERITY: How bad is the pain?  (Scale 1-10; mild, moderate or severe)   - MILD (1-3): doesn't interfere with normal activities    - MODERATE (4-7): interferes with normal activities or awakens from sleep    - SEVERE (8-10): excruciating pain, unable to do any normal activities      severe 4. URI SYMPTOMS: Do you have a runny nose or cough?     no 5. FEVER: Do you have a fever? If Yes, ask: What is your temperature, how was it measured, and when did it start?     no 6. CAUSE: Have you been swimming recently?, How often do you use Q-TIPS?, Have you had any recent air travel or scuba diving?     no 7. OTHER SYMPTOMS: Do you have any other symptoms? (e.g., headache, stiff neck, dizziness, vomiting, runny nose, decreased hearing)     Decreased hearing, sinus congestion 8. PREGNANCY: Is there any chance you are pregnant? When was your last menstrual period?     no  Protocols used: Earache-A-AH  Reason for Disposition  [1] SEVERE pain AND [2] not improved 2 hours after taking analgesic medication (e.g., ibuprofen  or acetaminophen )  Answer Assessment - Initial  Assessment Questions 1. LOCATION: Which ear is involved?     Left ear 2. ONSET: When did the ear start hurting      overnight 3. SEVERITY: How bad is the pain?  (Scale 1-10; mild, moderate or severe)   - MILD (1-3): doesn't interfere with normal activities    - MODERATE (4-7): interferes with normal activities or awakens from sleep    - SEVERE (8-10): excruciating pain, unable to do any normal activities      severe 4. URI SYMPTOMS: Do you have a runny nose or cough?     no 5. FEVER: Do you have a fever? If Yes, ask: What is your temperature, how was it measured, and when did it start?     no 6. CAUSE: Have you been swimming recently?, How often do you use Q-TIPS?, Have you had any recent air travel or scuba diving?     no 7. OTHER SYMPTOMS: Do you have any other symptoms? (e.g., headache, stiff neck, dizziness, vomiting, runny nose, decreased hearing)     Decreased hearing, sinus congestion 8. PREGNANCY: Is there any chance you are pregnant? When was your last menstrual period?     no  Protocols used: [place]

## 2023-10-03 DIAGNOSIS — H6690 Otitis media, unspecified, unspecified ear: Secondary | ICD-10-CM | POA: Insufficient documentation

## 2023-10-03 NOTE — Assessment & Plan Note (Signed)
 Findings discussed with patient.  Start augmentin , restart flonase . Ibuprofen  if needed.  She agrees with plan.  Update us  as needed.  Not breast-feeding.

## 2023-10-10 ENCOUNTER — Encounter: Payer: Self-pay | Admitting: Family Medicine

## 2023-10-11 ENCOUNTER — Telehealth: Payer: Self-pay | Admitting: Family Medicine

## 2023-10-11 DIAGNOSIS — H7292 Unspecified perforation of tympanic membrane, left ear: Secondary | ICD-10-CM | POA: Diagnosis not present

## 2023-10-11 NOTE — Telephone Encounter (Signed)
 Please see about getting this patient scheduled as soon as possible re: ear pain.  Thanks.    Routed to PCP as FYI.

## 2023-10-11 NOTE — Telephone Encounter (Signed)
 Copied from CRM [id number]. Topic: General - Call Back - No Documentation >> Oct 11, 2023  8:05 AM Deleta RAMAN wrote: Reason for CRM: patient would like to follow up with the nurse about last week appointment call back at [phone]

## 2023-10-11 NOTE — Telephone Encounter (Signed)
 Lvm to schedule apt in clinic

## 2023-10-11 NOTE — Telephone Encounter (Signed)
 Please see about scheduling patient to be seen

## 2023-10-11 NOTE — Telephone Encounter (Signed)
Please call patient to schedule an appointment. Thank you.

## 2023-10-12 NOTE — Telephone Encounter (Signed)
 Got ear drops at [facility] and referral to ENT they said she had a perforated ear drum but no fluid behind the ear.

## 2023-10-12 NOTE — Telephone Encounter (Signed)
 Noted. Glad she is taken care of. I appreciate everyone's involvement.

## 2023-10-31 NOTE — Telephone Encounter (Signed)
 I was not in office on 10/30/23. Unable to reach pt by phone but left v/m requesting pt to cb for triage. Pt can speak with E2C2 for triage when pt calls back. Sending note to K Gretta LOISE Gretta pool and [facility] triage.

## 2023-11-01 ENCOUNTER — Ambulatory Visit: Payer: Self-pay

## 2023-11-01 DIAGNOSIS — R8290 Unspecified abnormal findings in urine: Secondary | ICD-10-CM | POA: Diagnosis not present

## 2023-11-01 DIAGNOSIS — R5383 Other fatigue: Secondary | ICD-10-CM | POA: Diagnosis not present

## 2023-11-01 DIAGNOSIS — N920 Excessive and frequent menstruation with regular cycle: Secondary | ICD-10-CM | POA: Diagnosis not present

## 2023-11-01 DIAGNOSIS — Z3202 Encounter for pregnancy test, result negative: Secondary | ICD-10-CM | POA: Diagnosis not present

## 2023-11-01 NOTE — Telephone Encounter (Signed)
 Unableto reach pt by phone and left v/m requesting pt to call [phone] for triage. E2C2 can triage pt. MARLA Gaskins NP has also sent mychart note to pt today as well.sending to [facility] triage.

## 2023-11-01 NOTE — Telephone Encounter (Signed)
 FYI Only or Action Required?: FYI only for provider.  Patient was last seen in primary care on 10/01/2023 by Cleatus Arlyss RAMAN, MD.  Called Nurse Triage reporting Illness.  Symptoms began several days ago.  Interventions attempted: Nothing.  Symptoms are: unchanged.  Triage Disposition: See Physician Within 24 Hours  Patient/caregiver understands and will follow disposition?: Yes, will follow disposition  Copied from CRM #[id number]. Topic: General - Other >> Nov 01, 2023 10:14 AM Donee H wrote: Reason for CRM: Patient stating returning a call from nurse. Per notes patient was called to be triaged. Transferring to nurse triage Reason for Disposition  [1] MODERATE weakness (e.g., interferes with work, school, normal activities) AND [2] persists > 3 days  Answer Assessment - Initial Assessment Questions 1. DESCRIPTION: Describe how you are feeling.     Can't keep eyes open at time, states feels syncopal at time 2. SEVERITY: How bad is it?  Can you stand and walk?     Still stand and walks without difficulty 3. ONSET: When did these symptoms begin? (e.g., hours, days, weeks, months)     4 days this time, states happened last time after her cycle 4. CAUSE: What do you think is causing the weakness or fatigue? (e.g., not drinking enough fluids, medical problem, trouble sleeping)     Something with cycle 5. NEW MEDICINES:  Have you started on any new medicines recently? (e.g., opioid pain medicines, benzodiazepines, muscle relaxants, antidepressants, antihistamines, neuroleptics, beta blockers)     denies 6. OTHER SYMPTOMS: Do you have any other symptoms? (e.g., chest pain, fever, cough, SOB, vomiting, diarrhea, bleeding, other areas of pain)     denies 7. PREGNANCY: Is there any chance you are pregnant? When was your last menstrual period?     I wouldn't think so, is 6 months post partum  Protocols used: Weakness (Generalized) and Fatigue-A-AH

## 2023-11-02 ENCOUNTER — Ambulatory Visit: Admitting: Primary Care
